# Patient Record
Sex: Male | Born: 1975 | Race: White | Hispanic: No | Marital: Married | Smoking: Never smoker
Health system: Southern US, Community
[De-identification: ages and names within clinical notes are randomized; demographics above are authoritative.]

## PROBLEM LIST (undated history)

## (undated) DIAGNOSIS — M1990 Unspecified osteoarthritis, unspecified site: Secondary | ICD-10-CM

## (undated) DIAGNOSIS — I10 Essential (primary) hypertension: Secondary | ICD-10-CM

## (undated) HISTORY — DX: Essential (primary) hypertension: I10

## (undated) HISTORY — PX: SHOULDER SURGERY: SHX246

---

## 2010-07-09 ENCOUNTER — Ambulatory Visit: Payer: Self-pay | Admitting: Sports Medicine

## 2010-10-23 ENCOUNTER — Ambulatory Visit: Payer: Self-pay | Admitting: Family Medicine

## 2012-05-19 ENCOUNTER — Ambulatory Visit: Payer: Self-pay | Admitting: Family Medicine

## 2012-05-19 LAB — DOT URINE DIP: Specific Gravity: 1.005 (ref 1.003–1.030)

## 2014-07-18 ENCOUNTER — Ambulatory Visit: Payer: Self-pay | Admitting: Family Medicine

## 2017-05-08 ENCOUNTER — Telehealth: Payer: Self-pay | Admitting: Family

## 2017-05-08 DIAGNOSIS — B9689 Other specified bacterial agents as the cause of diseases classified elsewhere: Secondary | ICD-10-CM

## 2017-05-08 DIAGNOSIS — J028 Acute pharyngitis due to other specified organisms: Secondary | ICD-10-CM

## 2017-05-08 MED ORDER — AZITHROMYCIN 250 MG PO TABS: ORAL_TABLET | ORAL | 0 refills | Status: DC

## 2017-05-08 MED ORDER — BENZONATATE 100 MG PO CAPS: 100.0000 mg | ORAL_CAPSULE | Freq: Three times a day (TID) | ORAL | 0 refills | Status: DC | PRN

## 2017-05-08 NOTE — Progress Notes (Signed)

## 2018-10-30 ENCOUNTER — Other Ambulatory Visit: Payer: Self-pay

## 2018-10-30 ENCOUNTER — Ambulatory Visit
Admission: EM | Admit: 2018-10-30 | Discharge: 2018-10-30 | Disposition: A | Discharge status: Home / Self Care | Payer: BC Managed Care – PPO | Attending: Family Medicine | Admitting: Family Medicine

## 2018-10-30 ENCOUNTER — Ambulatory Visit (INDEPENDENT_AMBULATORY_CARE_PROVIDER_SITE_OTHER): Payer: BC Managed Care – PPO

## 2018-10-30 ENCOUNTER — Encounter: Payer: Self-pay | Admitting: Emergency Medicine

## 2018-10-30 DIAGNOSIS — W010XXA Fall on same level from slipping, tripping and stumbling without subsequent striking against object, initial encounter: Secondary | ICD-10-CM

## 2018-10-30 DIAGNOSIS — M25511 Pain in right shoulder: Secondary | ICD-10-CM

## 2018-10-30 DIAGNOSIS — S46911A Strain of unspecified muscle, fascia and tendon at shoulder and upper arm level, right arm, initial encounter: Secondary | ICD-10-CM | POA: Diagnosis not present

## 2018-10-30 MED ORDER — HYDROCODONE-ACETAMINOPHEN 5-325 MG PO TABS: ORAL_TABLET | ORAL | 0 refills | Status: DC

## 2018-10-30 NOTE — Discharge Instructions (Signed)
Rest, ice, sling for rest Follow up with orthopedist if no improvement

## 2018-10-30 NOTE — ED Provider Notes (Signed)
[facility]    CSN: [id number] Arrival date & time: 10/30/18  1405     History   Chief Complaint Chief Complaint  Patient presents with  . Shoulder Pain    HPI Bob Ryan is a 43 y.o. male.   43 yo male with a c/o right shoulder pain after falling today and landing on outstretched arm. States pain is worse with movement. States he's had problems with his shoulders in the past.    Shoulder Pain   History reviewed. No pertinent past medical history.  There are no active problems to display for this patient.   History reviewed. No pertinent surgical history.     Home Medications    Prior to Admission medications   Medication Sig Start Date End Date Taking? Authorizing Provider  azithromycin (ZITHROMAX) 250 MG tablet Take 2 tabs now then 1 daily times 4 days 05/08/17   Withrow, Elyse Jarvis, FNP  benzonatate (TESSALON PERLES) 100 MG capsule Take 1-2 capsules (100-200 mg total) by mouth every 8 (eight) hours as needed. 05/08/17   Withrow, Elyse Jarvis, FNP  HYDROcodone-acetaminophen (NORCO/VICODIN) 5-325 MG tablet 1-2 tabs po bid prn 10/30/18   Norval Gable, MD    Family History Family History  Problem Relation Age of Onset  . Healthy Mother   . Healthy Father     Social History Social History   Tobacco Use  . Smoking status: Never Smoker  . Smokeless tobacco: Current User    Types: Snuff  Substance Use Topics  . Alcohol use: Yes  . Drug use: Not on file     Allergies   Patient has no known allergies.   Review of Systems Review of Systems   Physical Exam Triage Vital Signs ED Triage Vitals  Enc Vitals Group     BP 10/30/18 1424 (!) 148/88     Pulse Rate 10/30/18 1424 81     Resp 10/30/18 1424 18     Temp 10/30/18 1424 98.4 F (36.9 C)     Temp src --      SpO2 10/30/18 1424 100 %     Weight 10/30/18 1424 200 lb (90.7 kg)     Height 10/30/18 1424 5\' 8"  (1.727 m)     Head Circumference --      Peak Flow --      Pain Score 10/30/18 1423  6     Pain Loc --      Pain Edu? --      Excl. in [place]? --    No data found.  Updated Vital Signs BP (!) 148/88 (BP Location: Left Arm)   Pulse 81   Temp 98.4 F (36.9 C)   Resp 18   Ht 5\' 8"  (1.727 m)   Wt 90.7 kg   SpO2 100%   BMI 30.41 kg/m   Visual Acuity Right Eye Distance:   Left Eye Distance:   Bilateral Distance:    Right Eye Near:   Left Eye Near:    Bilateral Near:     Physical Exam Vitals signs and nursing note reviewed.  Constitutional:      General: He is not in acute distress.    Appearance: He is not toxic-appearing or diaphoretic.  Musculoskeletal:     Right shoulder: He exhibits decreased range of motion, tenderness and swelling. He exhibits no effusion, no crepitus, no deformity, no laceration and normal pulse.  Neurological:     Mental Status: He is alert.  UC Treatments / Results  Labs (all labs ordered are listed, but only abnormal results are displayed) Labs Reviewed - No data to display  EKG   Radiology Dg Shoulder Right  Result Date: 10/30/2018 CLINICAL DATA:  Right shoulder pain after fall. EXAM: RIGHT SHOULDER - 2+ VIEW COMPARISON:  None. FINDINGS: There is no evidence of fracture or dislocation. There is no evidence of arthropathy or other focal bone abnormality. Soft tissues are unremarkable. IMPRESSION: Negative. Electronically Signed   By: Gerome Samavid  Williams III M.D   On: 10/30/2018 15:58    Procedures Procedures (including critical care time)  Medications Ordered in UC Medications - No data to display  Initial Impression / Assessment and Plan / UC Course  I have reviewed the triage vital signs and the nursing notes.  Pertinent labs & imaging results that were available during my care of the patient were reviewed by me and considered in my medical decision making (see chart for details).      Final Clinical Impressions(s) / UC Diagnoses   Final diagnoses:  Strain of right shoulder, initial encounter    ED  Prescriptions    Medication Sig Dispense Auth. Provider   HYDROcodone-acetaminophen (NORCO/VICODIN) 5-325 MG tablet 1-2 tabs po bid prn 10 tablet Payton Mccallumonty, Alexcis Bicking, MD      1. x-ray results (negative) and diagnosis reviewed with patient 2. rx as per orders above; reviewed possible side effects, interactions, risks and benefits  3. Recommend supportive treatment with rest, ice/heat 4. Follow-up prn if symptoms worsen or don't improve   Controlled Substance Prescriptions [place] Controlled Substance Registry consulted? Not Applicable   Payton Mccallumonty, Kolt Mcwhirter, MD 10/30/18 1630

## 2018-10-30 NOTE — ED Triage Notes (Signed)
Patient states he has had shoulder trouble for years but today fell off a piece of equipment and injured it again

## 2021-03-08 ENCOUNTER — Encounter: Payer: Self-pay | Admitting: Surgery

## 2021-03-08 ENCOUNTER — Encounter: Payer: Self-pay | Admitting: Emergency Medicine

## 2021-03-08 ENCOUNTER — Other Ambulatory Visit: Payer: Self-pay

## 2021-03-08 ENCOUNTER — Ambulatory Visit
Admission: EM | Admit: 2021-03-08 | Discharge: 2021-03-08 | Disposition: A | Discharge status: Home / Self Care | Payer: BC Managed Care – PPO | Attending: Emergency Medicine | Admitting: Emergency Medicine

## 2021-03-08 ENCOUNTER — Ambulatory Visit (INDEPENDENT_AMBULATORY_CARE_PROVIDER_SITE_OTHER): Payer: BC Managed Care – PPO | Admitting: Surgery

## 2021-03-08 ENCOUNTER — Other Ambulatory Visit: Payer: Self-pay | Admitting: Surgery

## 2021-03-08 VITALS — BP 154/103 | HR 81 | Temp 98.5°F | Ht 68.0 in | Wt 203.4 lb

## 2021-03-08 DIAGNOSIS — K644 Residual hemorrhoidal skin tags: Secondary | ICD-10-CM

## 2021-03-08 DIAGNOSIS — K649 Unspecified hemorrhoids: Secondary | ICD-10-CM

## 2021-03-08 DIAGNOSIS — K645 Perianal venous thrombosis: Secondary | ICD-10-CM

## 2021-03-08 MED ORDER — LIDOCAINE-PRILOCAINE 2.5-2.5 % EX CREA: 1.0000 "application " | TOPICAL_CREAM | CUTANEOUS | 0 refills | Status: DC | PRN

## 2021-03-08 MED ORDER — HYDROCORTISONE (PERIANAL) 2.5 % EX CREA: 1.0000 "application " | TOPICAL_CREAM | Freq: Two times a day (BID) | CUTANEOUS | 0 refills | Status: DC

## 2021-03-08 NOTE — Patient Instructions (Signed)
If you have any concerns or questions, please feel free to call our office.   Hemorrhoids Hemorrhoids are swollen veins that may develop: In the butt (rectum). These are called internal hemorrhoids. Around the opening of the butt (anus). These are called external hemorrhoids. Hemorrhoids can cause pain, itching, or bleeding. Most of the time, they do not cause serious problems. They usually get better with diet changes, lifestyle changes, and other home treatments. What are the causes? This condition may be caused by: Having trouble pooping (constipation). Pushing hard (straining) to poop. Watery poop (diarrhea). Pregnancy. Being very overweight (obese). Sitting for long periods of time. Heavy lifting or other activity that causes you to strain. Anal sex. Riding a bike for a long period of time. What are the signs or symptoms? Symptoms of this condition include: Pain. Itching or soreness in the butt. Bleeding from the butt. Leaking poop. Swelling in the area. One or more lumps around the opening of your butt. How is this diagnosed? A doctor can often diagnose this condition by looking at the affected area. The doctor may also: Do an exam that involves feeling the area with a gloved hand (digital rectal exam). Examine the area inside your butt using a small tube (anoscope). Order blood tests. This may be done if you have lost a lot of blood. Have you get a test that involves looking inside the colon using a flexible tube with a camera on the end (sigmoidoscopy or colonoscopy). How is this treated? This condition can usually be treated at home. Your doctor may tell you to change what you eat, make lifestyle changes, or try home treatments. If these do not help, procedures can be done to remove the hemorrhoids or make them smaller. These may involve: Placing rubber bands at the base of the hemorrhoids to cut off their blood supply. Injecting medicine into the hemorrhoids to shrink  them. Shining a type of light energy onto the hemorrhoids to cause them to fall off. Doing surgery to remove the hemorrhoids or cut off their blood supply. Follow these instructions at home: Eating and drinking  Eat foods that have a lot of fiber in them. These include whole grains, beans, nuts, fruits, and vegetables. Ask your doctor about taking products that have added fiber (fibersupplements). Reduce the amount of fat in your diet. You can do this by: Eating low-fat dairy products. Eating less red meat. Avoiding processed foods. Drink enough fluid to keep your pee (urine) pale yellow. Managing pain and swelling  Take a warm-water bath (sitz bath) for 20 minutes to ease pain. Do this 3-4 times a day. You may do this in a bathtub or using a portable sitz bath that fits over the toilet. If told, put ice on the painful area. It may be helpful to use ice between your warm baths. Put ice in a plastic bag. Place a towel between your skin and the bag. Leave the ice on for 20 minutes, 2-3 times a day. General instructions Take over-the-counter and prescription medicines only as told by your doctor. Medicated creams and medicines may be used as told. Exercise often. Ask your doctor how much and what kind of exercise is best for you. Go to the bathroom when you have the urge to poop. Do not wait. Avoid pushing too hard when you poop. Keep your butt dry and clean. Use wet toilet paper or moist towelettes after pooping. Do not sit on the toilet for a long time. Keep all follow-up visits   as told by your doctor. This is important. Contact a doctor if you: Have pain and swelling that do not get better with treatment or medicine. Have trouble pooping. Cannot poop. Have pain or swelling outside the area of the hemorrhoids. Get help right away if you have: Bleeding that will not stop. Summary Hemorrhoids are swollen veins in the butt or around the opening of the butt. They can cause pain,  itching, or bleeding. Eat foods that have a lot of fiber in them. These include whole grains, beans, nuts, fruits, and vegetables. Take a warm-water bath (sitz bath) for 20 minutes to ease pain. Do this 3-4 times a day. This information is not intended to replace advice given to you by your health care provider. Make sure you discuss any questions you have with your health care provider. Document Revised: 10/25/2020 Document Reviewed: 10/25/2020 Elsevier Patient Education  2022 [employer] Inc[employer]  

## 2021-03-08 NOTE — Progress Notes (Signed)
Patient ID: Bob Ryan, male   DOB: 06-30-1975, 45 y.o.   MRN: [id number]  Chief Complaint: Painful external hemorrhoid  History of Present Illness Bob Ryan is a 45 y.o. male with a 5-year history of hemorrhoids known to him.  It became its severest about 2 days ago.  He denies any prior history of thrombosis.  He denies any history of constipation reporting that he moves every day.  He admits there are times when he does strain, but most of this has to do with being in a hurry.  He denies any blood in his stools.  He reports pain and itching.  He has utilized sitz bath since the onset.  He is also utilize Preparation H topically without significant relief and Tucks pads.  He denies any family history of colon cancer and has never had a colonoscopy.  Past Medical History History reviewed. No pertinent past medical history.    History reviewed. No pertinent surgical history.  No Known Allergies  Current Outpatient Medications  Medication Sig Dispense Refill   hydrocortisone (ANUSOL-HC) 2.5 % rectal cream Place 1 application rectally 2 (two) times daily. (Patient not taking: Reported on 03/08/2021) 30 g 0   lidocaine-prilocaine (EMLA) cream Apply 1 application topically as needed. (Patient not taking: Reported on 03/08/2021) 30 g 0   No current facility-administered medications for this visit.    Family History Family History  Problem Relation Age of Onset   Healthy Mother    Healthy Father       Social History Social History   Tobacco Use   Smoking status: Never   Smokeless tobacco: Current    Types: Snuff  Substance Use Topics   Alcohol use: Yes        Review of Systems  All other systems reviewed and are negative.    Physical Exam Blood pressure (!) 154/103, pulse 81, temperature 98.5 F (36.9 C), temperature source Oral, height 5\' 8"  (1.727 m), weight 203 lb 6.4 oz (92.3 kg), SpO2 99 %. Last Weight  Most recent update: 03/08/2021  3:28 PM    Weight   92.3 kg (203 lb 6.4 oz)             CONSTITUTIONAL: Well developed, and nourished, appropriately responsive and aware without distress.   EYES: Sclera non-icteric.   EARS, NOSE, MOUTH AND THROAT: Mask worn.   The oropharynx is clear. Oral mucosa is pink and moist.    Hearing is intact to voice.  NECK: Trachea is midline, and there is no jugular venous distension.  LYMPH NODES:  Lymph nodes in the neck are not enlarged. RESPIRATORY:  Lungs are clear, and breath sounds are equal bilaterally. Normal respiratory effort without pathologic use of accessory muscles. CARDIOVASCULAR: Heart is regular in rate and rhythm. GI: The abdomen is soft, nontender, and nondistended. There were no palpable masses. I did not appreciate hepatosplenomegaly. There were normal bowel sounds. GU: External to the anus on the patient's left side there is a large 2 to 2-1/2 cm thrombosed mass consistent with a large external hemorrhoidal thrombosis.  Fortunately there appears to be a small pedicle upon which this lies.  It is difficult to assess the degree of internal hemorrhoids present but this external hemorrhoid seems relatively mobile and I believe amenable to excision under local anesthesia.  The patient is highly motivated to proceed with this approach rather than deferring scheduling surgery. MUSCULOSKELETAL:  Symmetrical muscle tone appreciated in all four extremities.    SKIN: Skin  turgor is normal. No pathologic skin lesions appreciated.  NEUROLOGIC:  Motor and sensation appear grossly normal.  Cranial nerves are grossly without defect. PSYCH:  Alert and oriented to person, place and time. Affect is appropriate for situation.  Data Reviewed I have personally reviewed what is currently available of the patient's imaging, recent labs and medical records.   Labs:  No flowsheet data found. No flowsheet data found.    Imaging:  Within last 24 hrs: No results found.  Assessment    External thrombosed  hemorrhoid. There are no problems to display for this patient.   Plan    External hemorrhoidectomy.  Procedure detail: After informed consent was obtained the patient was placed in a kneeling position, the region was prepped with Betadine, and local anesthesia of 1% lidocaine with epinephrine is performed to adequate anesthetic effect.  This hemostat was placed across the base of the external hemorrhoid, and left intact for 3 minutes.  With the clamp in place the external hemorrhoid was sharply excised with a 15 blade.  The clamp was removed with excellent hemostasis.  An ABD pad was given to protect this area and secured with the patient's underwear. Patient was advised to use the topical anesthetic agents, complete sits baths on a 3 times daily and as needed basis.  He was advised to expect some degree of bleeding with future bowel movements. We will have him follow-up in 2 to 3 weeks or as needed. Face-to-face time spent with the patient and accompanying care providers(if present) was 50 minutes, with more than 50% of the time spent counseling, educating, and coordinating care of the patient.    These notes generated with voice recognition software. I apologize for typographical errors.  Campbell Lerner M.D., FACS 03/08/2021, 8:20 PM

## 2021-03-08 NOTE — ED Triage Notes (Signed)
Pt presents today with rectal pain that he describes as "a Hemorroid". He reports having a hx x 5 years but has gotten worse since Tuesday morning. He has used OTC Prep H, Tucks cream/wipes with no relief. Denies bleeding.

## 2021-03-08 NOTE — ED Provider Notes (Signed)
[facility]    CSN: [id number] Arrival date & time: 03/08/21  7616      History   Chief Complaint Chief Complaint  Patient presents with   Rectal Pain    HPI Bob Ryan is a 45 y.o. male.   Patient presents with external hemorrhoid causing rectal pain for 3 days.  Endorses hemorrhoid has increased in size and become more tender.  Hemorrhoid has been present for 5 years. denies rectal bleeding.  Endorses that he has regular bowel movements with no issue of constipation, endorses that he takes MiraLAX as needed to prevent recurrence.  Last bowel movement this morning.  Has attempted use of Preparation H and Tucks with no relief.  Does not have primary care doctor.  History reviewed. No pertinent past medical history.  There are no problems to display for this patient.   History reviewed. No pertinent surgical history.     Home Medications    Prior to Admission medications   Medication Sig Start Date End Date Taking? Authorizing Provider  hydrocortisone (ANUSOL-HC) 2.5 % rectal cream Place 1 application rectally 2 (two) times daily. 03/08/21  Yes Mattis Featherly R, NP  lidocaine-prilocaine (EMLA) cream Apply 1 application topically as needed. 03/08/21  Yes Uziel Covault, Elita Boone, NP    Family History Family History  Problem Relation Age of Onset   Healthy Mother    Healthy Father     Social History Social History   Tobacco Use   Smoking status: Never   Smokeless tobacco: Current    Types: Snuff  Substance Use Topics   Alcohol use: Yes     Allergies   Patient has no known allergies.   Review of Systems Review of Systems  Constitutional: Negative.   Respiratory: Negative.    Cardiovascular: Negative.   Gastrointestinal: Negative.   Genitourinary: Negative.   Skin: Negative.     Physical Exam Triage Vital Signs ED Triage Vitals  Enc Vitals Group     BP 03/08/21 1016 (!) 161/97     Pulse Rate 03/08/21 1016 88     Resp 03/08/21 1016  18     Temp 03/08/21 1016 98.7 F (37.1 C)     Temp Source 03/08/21 1016 Oral     SpO2 03/08/21 1016 95 %     Weight --      Height --      Head Circumference --      Peak Flow --      Pain Score 03/08/21 1014 5     Pain Loc --      Pain Edu? --      Excl. in [place]? --    No data found.  Updated Vital Signs BP (!) 161/97 (BP Location: Right Arm)   Pulse 88   Temp 98.7 F (37.1 C) (Oral)   Resp 18   SpO2 95%   Visual Acuity Right Eye Distance:   Left Eye Distance:   Bilateral Distance:    Right Eye Near:   Left Eye Near:    Bilateral Near:     Physical Exam Genitourinary:    Rectum: Tenderness and external hemorrhoid present. No anal fissure or internal hemorrhoid. Normal anal tone.       Comments: No rectal bleeding, strangulated noted, hemorrhoid is firm but not hardened      UC Treatments / Results  Labs (all labs ordered are listed, but only abnormal results are displayed) Labs Reviewed - No data to display  EKG  Radiology No results found.  Procedures Procedures (including critical care time)  Medications Ordered in UC Medications - No data to display  Initial Impression / Assessment and Plan / UC Course  I have reviewed the triage vital signs and the nursing notes.  Pertinent labs & imaging results that were available during my care of the patient were reviewed by me and considered in my medical decision making (see chart for details).  External hemorrhoid  Hydrocortisone 2.5% rectal cream twice daily Lidocaine-prilocaine cream as needed Given walking referral for surgical evaluation for removal Strict return precautions and warning signs discussed about strangulation and when to return to emergency department for evaluation Final Clinical Impressions(s) / UC Diagnoses   Final diagnoses:  External hemorrhoid     Discharge Instructions      You can apply hydrocortisone cream to hemorrhoid twice a day, this medication helps with  inflammation and ideally will help to reduce the size   You can apply lidocaine cream to area as needed, this medication is for comfort as lidocaine has a very temporary numbing effect  You may follow-up with [facility], information listed below for evaluation for removal of your hemorrhoid if symptoms do not improve or you just want it to be gone   ED Prescriptions     Medication Sig Dispense Auth. Provider   hydrocortisone (ANUSOL-HC) 2.5 % rectal cream Place 1 application rectally 2 (two) times daily. 30 g Ethelene Closser R, NP   lidocaine-prilocaine (EMLA) cream Apply 1 application topically as needed. 30 g Valinda Hoar, NP      PDMP not reviewed this encounter.   Valinda Hoar, NP 03/08/21 1043

## 2021-03-08 NOTE — Discharge Instructions (Signed)
You can apply hydrocortisone cream to hemorrhoid twice a day, this medication helps with inflammation and ideally will help to reduce the size   You can apply lidocaine cream to area as needed, this medication is for comfort as lidocaine has a very temporary numbing effect  You may follow-up with [facility], information listed below for evaluation for removal of your hemorrhoid if symptoms do not improve or you just want it to be gone

## 2021-04-24 ENCOUNTER — Ambulatory Visit
Admission: EM | Admit: 2021-04-24 | Discharge: 2021-04-24 | Disposition: A | Discharge status: Home / Self Care | Payer: BC Managed Care – PPO | Attending: Internal Medicine | Admitting: Internal Medicine

## 2021-04-24 ENCOUNTER — Ambulatory Visit (INDEPENDENT_AMBULATORY_CARE_PROVIDER_SITE_OTHER): Payer: BC Managed Care – PPO

## 2021-04-24 ENCOUNTER — Other Ambulatory Visit: Payer: Self-pay

## 2021-04-24 DIAGNOSIS — R509 Fever, unspecified: Secondary | ICD-10-CM

## 2021-04-24 DIAGNOSIS — J40 Bronchitis, not specified as acute or chronic: Secondary | ICD-10-CM

## 2021-04-24 DIAGNOSIS — R059 Cough, unspecified: Secondary | ICD-10-CM

## 2021-04-24 MED ORDER — AZITHROMYCIN 250 MG PO TABS: ORAL_TABLET | ORAL | 0 refills | Status: DC

## 2021-04-24 MED ORDER — BENZONATATE 200 MG PO CAPS: 200.0000 mg | ORAL_CAPSULE | Freq: Two times a day (BID) | ORAL | 0 refills | Status: DC | PRN

## 2021-04-24 MED ORDER — ALBUTEROL SULFATE HFA 108 (90 BASE) MCG/ACT IN AERS: 2.0000 | INHALATION_SPRAY | RESPIRATORY_TRACT | 0 refills | Status: DC | PRN

## 2021-04-24 MED ORDER — HYDROCODONE BIT-HOMATROP MBR 5-1.5 MG/5ML PO SOLN: 5.0000 mL | Freq: Every evening | ORAL | 0 refills | Status: DC

## 2021-04-24 NOTE — ED Triage Notes (Signed)
Pt presents with 10 days of illness.  Vomiting/fever started 12/17 lasting a few days.  Symptoms mostly resolved after a few days then returned.  Fever gone since Sunday night.  Continues cough productive for brown mucous, sinus drainage has reddish tinge.  Feels congested.  Still has chills.  General feeling of unwell.    Has tried OTC cold/flu meds with no relief.

## 2021-04-24 NOTE — ED Provider Notes (Signed)
[facility]    CSN: [id number] Arrival date & time: 04/24/21  3474      History   Chief Complaint Chief Complaint  Patient presents with   Cough    HPI Bob Ryan is a 45 y.o. male who presents with URI x 10 days. Had a fever last week but resolved  2 days ago. Vomited for a few days starting 12/17. He continues to have a productive cough with brown sputum and nose drainage has a reddish tinge. Continues to have chills. He did not do a covid test during his sickness. Has been fatigued, with decreased appetite and has continued sweating.    History reviewed. No pertinent past medical history.  There are no problems to display for this patient.   History reviewed. No pertinent surgical history.     Home Medications    Prior to Admission medications   Medication Sig Start Date End Date Taking? Authorizing Provider  albuterol (VENTOLIN HFA) 108 (90 Base) MCG/ACT inhaler Inhale 2 puffs into the lungs every 4 (four) hours as needed for wheezing or shortness of breath. For 7 days 04/24/21  Yes Rodriguez-Southworth, Nettie Elm, PA-C  azithromycin (ZITHROMAX Z-PAK) 250 MG tablet Take 2 today then one qd x 4 days 04/24/21  Yes Rodriguez-Southworth, Nettie Elm, PA-C  benzonatate (TESSALON) 200 MG capsule Take 1 capsule (200 mg total) by mouth 2 (two) times daily as needed. For day time cough 04/24/21  Yes Rodriguez-Southworth, Nettie Elm, PA-C  HYDROcodone bit-homatropine (HYCODAN) 5-1.5 MG/5ML syrup Take 5 mLs by mouth at bedtime. Prn night time cough 04/24/21  Yes Rodriguez-Southworth, Nettie Elm, PA-C    Family History Family History  Problem Relation Age of Onset   Healthy Mother    Healthy Father     Social History Social History   Tobacco Use   Smoking status: Never   Smokeless tobacco: Current    Types: Snuff  Vaping Use   Vaping Use: Never used  Substance Use Topics   Alcohol use: Yes    Comment: occ     Allergies   Patient has no known  allergies.   Review of Systems Review of Systems  Constitutional:  Positive for activity change, appetite change, chills, diaphoresis and fatigue. Negative for fever.  HENT:  Positive for congestion and postnasal drip. Negative for ear discharge, ear pain, sore throat and trouble swallowing.   Eyes:  Negative for discharge.  Respiratory:  Positive for cough and wheezing. Negative for chest tightness and shortness of breath.   Cardiovascular:  Negative for chest pain.  Gastrointestinal:  Negative for diarrhea, nausea and vomiting.  Musculoskeletal:  Negative for myalgias.  Skin:  Negative for rash.  Neurological:  Negative for headaches.    Physical Exam Triage Vital Signs ED Triage Vitals  Enc Vitals Group     BP 04/24/21 0838 (!) 141/95     Pulse Rate 04/24/21 0838 68     Resp 04/24/21 0838 18     Temp 04/24/21 0838 98.2 F (36.8 C)     Temp Source 04/24/21 0838 Oral     SpO2 04/24/21 0838 98 %     Weight --      Height --      Head Circumference --      Peak Flow --      Pain Score 04/24/21 0835 0     Pain Loc --      Pain Edu? --      Excl. in GC? --    No  data found.  Updated Vital Signs BP (!) 141/95 (BP Location: Left Arm)    Pulse 68    Temp 98.2 F (36.8 C) (Oral)    Resp 18    SpO2 98%   Visual Acuity Right Eye Distance:   Left Eye Distance:   Bilateral Distance:    Right Eye Near:   Left Eye Near:    Bilateral Near:     Physical Exam Physical Exam Constitutional:      General: He is not in acute distress.    Appearance: He is not toxic-appearing.  HENT:     Head: Normocephalic.     Right Ear: Tympanic membrane, ear canal and external ear normal.     Left Ear: Ear canal and external ear normal.     Nose: Nose normal.     Mouth/Throat:     Mouth: Mucous membranes are moist.     Pharynx: Oropharynx is clear.  Eyes:     General: No scleral icterus.    Conjunctiva/sclera: Conjunctivae normal.  Cardiovascular:     Rate and Rhythm: Normal rate  and regular rhythm.     Heart sounds: No murmur heard.   Pulmonary:     Effort: Pulmonary effort is normal. No respiratory distress.     Breath sounds: Wheezing present.     Comments: Has a wheezy cough Musculoskeletal:        General: Normal range of motion.     Cervical back: Neck supple.  Lymphadenopathy:     Cervical: No cervical adenopathy.  Skin:    General: Skin is warm and dry.     Findings: No rash.  Neurological:     Mental Status: He is alert and oriented to person, place, and time.     Gait: Gait normal.  Psychiatric:        Mood and Affect: Mood normal.        Behavior: Behavior normal.        Thought Content: Thought content normal.        Judgment: Judgment normal.    UC Treatments / Results  Labs (all labs ordered are listed, but only abnormal results are displayed) Labs Reviewed - No data to display  EKG   Radiology DG Chest 2 View  Result Date: 04/24/2021 CLINICAL DATA:  Cough and fever EXAM: CHEST - 2 VIEW COMPARISON:  None. FINDINGS: The heart size and mediastinal contours are within normal limits. Both lungs are clear. The visualized skeletal structures are unremarkable. IMPRESSION: No active cardiopulmonary disease. Electronically Signed   By: Allegra Lai M.D.   On: 04/24/2021 09:04    Procedures Procedures (including critical care time)  Medications Ordered in UC Medications - No data to display  Initial Impression / Assessment and Plan / UC Course  I have reviewed the triage vital signs and the nursing notes. Pertinent imaging results that were available during my care of the patient were reviewed by me and considered in my medical decision making (see chart for details). Has bacterial bronchitis. I suspect he probably had covid I placed him on Azithromycin, Tessalon, Albuterol inhaler and Hycodan as noted.     Final Clinical Impressions(s) / UC Diagnoses   Final diagnoses:  Bronchitis   Discharge Instructions   None    ED  Prescriptions     Medication Sig Dispense Auth. Provider   azithromycin (ZITHROMAX Z-PAK) 250 MG tablet Take 2 today then one qd x 4 days 6 tablet Rodriguez-Southworth, Nettie Elm, PA-C   albuterol (  VENTOLIN HFA) 108 (90 Base) MCG/ACT inhaler Inhale 2 puffs into the lungs every 4 (four) hours as needed for wheezing or shortness of breath. For 7 days 1 g Rodriguez-Southworth, Nettie Elm, PA-C   benzonatate (TESSALON) 200 MG capsule Take 1 capsule (200 mg total) by mouth 2 (two) times daily as needed. For day time cough 30 capsule Rodriguez-Southworth, Briggett Tuccillo, PA-C   HYDROcodone bit-homatropine (HYCODAN) 5-1.5 MG/5ML syrup Take 5 mLs by mouth at bedtime. Prn night time cough 120 mL Rodriguez-Southworth, Nettie Elm, PA-C      I have reviewed the PDMP during this encounter.   Garey Ham, [place] 04/24/21 [phone]

## 2021-07-27 ENCOUNTER — Other Ambulatory Visit: Payer: Self-pay

## 2021-07-27 ENCOUNTER — Ambulatory Visit
Admission: RE | Admit: 2021-07-27 | Discharge: 2021-07-27 | Disposition: A | Discharge status: Home / Self Care | Payer: BC Managed Care – PPO | Source: Ambulatory Visit | Attending: Emergency Medicine | Admitting: Emergency Medicine

## 2021-07-27 VITALS — BP 157/108 | HR 87 | Temp 98.4°F | Resp 15 | Ht 68.0 in | Wt 205.0 lb

## 2021-07-27 DIAGNOSIS — R030 Elevated blood-pressure reading, without diagnosis of hypertension: Secondary | ICD-10-CM

## 2021-07-27 MED ORDER — LISINOPRIL 5 MG PO TABS: 5.0000 mg | ORAL_TABLET | Freq: Every day | ORAL | 0 refills | Status: DC

## 2021-07-27 NOTE — ED Triage Notes (Signed)
Patient states that he went yesterday for a dental appointment yesterday and was told his BP was elevated.  Patient states that yesterday he did have pressure in his head but is not having it today.  Patient states that he has not been on any BP medicines in the past. Patient does not have a PCP.  ?

## 2021-07-27 NOTE — Discharge Instructions (Addendum)
Begin use of lisinopril daily taking around the same time ? ?Take blood pressure daily around the same time while calm and relaxed, record and take with you to primary care ? ?Avoid use of increased salt intake  ? ? ?Appointment with Primary care May 5th at 2 pm.  Primary care is information is listed below, please call office if needing to reschedule appointment ? ?At any point if you begin to have a severe headache, shortness of breath, chest pain or tightness, dizziness or lightheadedness and your blood pressure is elevated you will need to go to the nearest emergency department for further evaluation and management ? ? ? ? ? ? ? ?

## 2021-07-27 NOTE — ED Provider Notes (Signed)
?[facility] ? ? ? ?CSN: [id number] ?Arrival date & time: 07/27/21  4098 ? ? ?  ? ?History   ?Chief Complaint ?Chief Complaint  ?Patient presents with  ? Appointment  ? Hypertension  ? ? ?HPI ?Bob Ryan is a 46 y.o. male.  ? ?Patient presents for evaluation of his blood pressure after being notified of elevation at the dentist office 1 day ago.  Blood pressure was 167/102 at dentist.  Cherlynn Perches history of elevated blood pressure in the 20s, started medication briefly but was shortly taken off, denies diagnosis of hypertension.  Endorses that he intermittently has headaches and a sensation of feeling flushed primarily when he is very busy, unsure if this is related.  Endorses that 1 week ago he had a episode of lightheadedness while working out at [employer], resolved after resting eating.  Denies shortness of breath, chest pain or tightness, dizziness, lightheadedness, generalized weakness or visual disturbance. ? ?History reviewed. No pertinent past medical history. ? ?There are no problems to display for this patient. ? ? ?History reviewed. No pertinent surgical history. ? ? ? ? ?Home Medications   ? ?Prior to Admission medications   ?Medication Sig Start Date End Date Taking? Authorizing Provider  ?albuterol (VENTOLIN HFA) 108 (90 Base) MCG/ACT inhaler Inhale 2 puffs into the lungs every 4 (four) hours as needed for wheezing or shortness of breath. For 7 days 04/24/21   Rodriguez-Southworth, Nettie Elm, PA-C  ?azithromycin (ZITHROMAX Z-PAK) 250 MG tablet Take 2 today then one qd x 4 days 04/24/21   Rodriguez-Southworth, Nettie Elm, PA-C  ?benzonatate (TESSALON) 200 MG capsule Take 1 capsule (200 mg total) by mouth 2 (two) times daily as needed. For day time cough 04/24/21   Rodriguez-Southworth, Nettie Elm, PA-C  ?HYDROcodone bit-homatropine (HYCODAN) 5-1.5 MG/5ML syrup Take 5 mLs by mouth at bedtime. Prn night time cough 04/24/21   Rodriguez-Southworth, Nettie Elm, PA-C  ? ? ?Family History ?Family History  ?Problem  Relation Age of Onset  ? Healthy Mother   ? Healthy Father   ? ? ?Social History ?Social History  ? ?Tobacco Use  ? Smoking status: Never  ? Smokeless tobacco: Current  ?  Types: Snuff  ?Vaping Use  ? Vaping Use: Never used  ?Substance Use Topics  ? Alcohol use: Yes  ?  Comment: occ  ? Drug use: Never  ? ? ? ?Allergies   ?Patient has no known allergies. ? ? ?Review of Systems ?Review of Systems  ?Constitutional: Negative.   ?Respiratory: Negative.    ?Cardiovascular: Negative.   ?Skin: Negative.   ?Neurological: Negative.   ? ? ?Physical Exam ?Triage Vital Signs ?ED Triage Vitals  ?Enc Vitals Group  ?   BP 07/27/21 1004 (!) 157/108  ?   Pulse Rate 07/27/21 1004 87  ?   Resp 07/27/21 1004 15  ?   Temp 07/27/21 1004 98.4 ?F (36.9 ?C)  ?   Temp Source 07/27/21 1004 Oral  ?   SpO2 07/27/21 1004 99 %  ?   Weight 07/27/21 1002 205 lb (93 kg)  ?   Height 07/27/21 1002 5\' 8"  (1.727 m)  ?   Head Circumference --   ?   Peak Flow --   ?   Pain Score 07/27/21 1002 0  ?   Pain Loc --   ?   Pain Edu? --   ?   Excl. in GC? --   ? ?No data found. ? ?Updated Vital Signs ?BP (!) 157/108 (BP Location: Left  Arm)   Pulse 87   Temp 98.4 ?F (36.9 ?C) (Oral)   Resp 15   Ht 5\' 8"  (1.727 m)   Wt 205 lb (93 kg)   SpO2 99%   BMI 31.17 kg/m?  ? ?Visual Acuity ?Right Eye Distance:   ?Left Eye Distance:   ?Bilateral Distance:   ? ?Right Eye Near:   ?Left Eye Near:    ?Bilateral Near:    ? ?Physical Exam ?Constitutional:   ?   Appearance: Normal appearance.  ?HENT:  ?   Head: Normocephalic.  ?Eyes:  ?   Extraocular Movements: Extraocular movements intact.  ?Cardiovascular:  ?   Rate and Rhythm: Normal rate and regular rhythm.  ?   Pulses: Normal pulses.  ?   Heart sounds: Normal heart sounds.  ?Pulmonary:  ?   Effort: Pulmonary effort is normal.  ?   Breath sounds: Normal breath sounds.  ?Skin: ?   General: Skin is warm.  ?Neurological:  ?   Mental Status: He is alert and oriented to person, place, and time. Mental status is at baseline.   ?Psychiatric:     ?   Mood and Affect: Mood normal.     ?   Behavior: Behavior normal.  ? ? ? ?UC Treatments / Results  ?Labs ?(all labs ordered are listed, but only abnormal results are displayed) ?Labs Reviewed - No data to display ? ?EKG ? ? ?Radiology ?No results found. ? ?Procedures ?Procedures (including critical care time) ? ?Medications Ordered in UC ?Medications - No data to display ? ?Initial Impression / Assessment and Plan / UC Course  ?I have reviewed the triage vital signs and the nursing notes. ? ?Pertinent labs & imaging results that were available during my care of the patient were reviewed by me and considered in my medical decision making (see chart for details). ? ?Elevated blood pressure reading in office without diagnosis of hypertension ? ?Blood pressure in triage 157/108, unsure if symptoms the patient has been experiencing over the last few months is related to blood pressure however due to recent elevations in history of needing medication in the past will start patient on lisinopril 5 mg daily with follow-up with PCP in 1 month, appointment scheduled with [facility], advised patient to decrease salt intake, advised patient to check blood pressure daily and record until seen by PCP, given signs of hypertensive urgency and when to follow-up for evaluation ? ? ?Final Clinical Impressions(s) / UC Diagnoses  ? ?Final diagnoses:  ?None  ? ?Discharge Instructions   ?None ?  ? ?ED Prescriptions   ?None ?  ? ?PDMP not reviewed this encounter. ?  ? , NP ?07/27/21 1054 ? ?

## 2021-08-31 ENCOUNTER — Ambulatory Visit (INDEPENDENT_AMBULATORY_CARE_PROVIDER_SITE_OTHER): Payer: BC Managed Care – PPO | Admitting: Family Medicine

## 2021-08-31 ENCOUNTER — Encounter: Payer: Self-pay | Admitting: Family Medicine

## 2021-08-31 VITALS — BP 150/100 | HR 92 | Ht 68.0 in | Wt 208.6 lb

## 2021-08-31 DIAGNOSIS — F5101 Primary insomnia: Secondary | ICD-10-CM | POA: Diagnosis not present

## 2021-08-31 DIAGNOSIS — Z13220 Encounter for screening for lipoid disorders: Secondary | ICD-10-CM | POA: Diagnosis not present

## 2021-08-31 DIAGNOSIS — R0683 Snoring: Secondary | ICD-10-CM | POA: Diagnosis not present

## 2021-08-31 DIAGNOSIS — I10 Essential (primary) hypertension: Secondary | ICD-10-CM

## 2021-08-31 MED ORDER — LISINOPRIL-HYDROCHLOROTHIAZIDE 10-12.5 MG PO TABS: 1.0000 | ORAL_TABLET | Freq: Every day | ORAL | 0 refills | Status: DC

## 2021-08-31 NOTE — Progress Notes (Signed)
?  ? ?Primary Care / Sports Medicine Office Visit ? ?Patient Information:  ?Patient ID: Bob Ryan, male DOB: [birth year] Age: 46 y.o. MRN: [id number]  ? ?Bob Ryan is a pleasant 46 y.o. male presenting with the following: ? ?Chief Complaint  ?Patient presents with  ? Establish Care  ? Hypertension  ? ? ?Vitals:  ? 08/31/21 1341  ?BP: (!) 150/100  ?Pulse: 92  ?SpO2: 96%  ? ?Vitals:  ? 08/31/21 1341  ?Weight: 208 lb 9.6 oz (94.6 kg)  ?Height: 5\' 8"  (1.727 m)  ? ?Body mass index is 31.72 kg/m?. ? ?No results found.  ? ?Independent interpretation of notes and tests performed by another provider:  ? ?None ? ?Procedures performed:  ? ?None ? ?Pertinent History, Exam, Impression, and Recommendations:  ? ?Problem List Items Addressed This Visit   ? ?  ? Cardiovascular and Mediastinum  ? Hypertension - Primary  ?  Chronic issue reported from patient's 20s, no consistent treatment, has been asymptomatic from a cardiopulmonary standpoint and elevated BP readings were incidentally noted at a recent dentist visit.  They have been recalcitrant despite initiation of lisinopril 5 mg through urgent care.  He denies any headache, vision changes, shortness of air, chest pain, has noted vascular prominence and some muscular aches with activity primarily affecting his upper extremities. ? ?Examination reveals no JVD, he has positive S1 and S2, regular rate and rhythm, no additional heart sounds noted, lung fields are clear to auscultation without bibasilar Rales, wheezes, rhonchi, he has symmetric pulses in the extremities, no carotid bruits, no edema at the ankles. ? ?At this stage plan for risk stratification labs, further titration to lisinopril-hydrochlorothiazide 10-12.5, referral to ENT/sleep medicine, lifestyle modification information, and close follow-up in 1 month. ? ?  ?  ? Relevant Medications  ? lisinopril-hydrochlorothiazide (ZESTORETIC) 10-12.5 MG tablet  ? Other Relevant Orders  ? Apo A1 + B + Ratio  ?  Comprehensive metabolic panel  ? Lipid panel  ? TSH  ? CBC  ? Ambulatory referral to Sleep Studies  ?  ? Other  ? Snoring  ?  Chronic condition with progressive worsening over the past several years, specifically sonorous sleeping, daytime somnolence, lack of restful sleep.  He has markedly elevated blood pressure readings and we will pursue ENT/sleep medicine referral for further evaluation of OSA as a possible contributory risk.  Referral placed today in that regard. ? ?  ?  ? Relevant Orders  ? Ambulatory referral to Sleep Studies  ? ?Other Visit Diagnoses   ? ? Screening for lipoid disorders      ? Relevant Orders  ? Apo A1 + B + Ratio  ? Lipid panel  ? Primary insomnia      ? Relevant Orders  ? Ambulatory referral to Sleep Studies  ? ?  ?  ? ?Orders & Medications ?Meds ordered this encounter  ?Medications  ? lisinopril-hydrochlorothiazide (ZESTORETIC) 10-12.5 MG tablet  ?  Sig: Take 1 tablet by mouth daily.  ?  Dispense:  30 tablet  ?  Refill:  0  ? ?Orders Placed This Encounter  ?Procedures  ? Apo A1 + B + Ratio  ? Comprehensive metabolic panel  ? Lipid panel  ? TSH  ? CBC  ? Ambulatory referral to Sleep Studies  ?  ? ?Return in about 4 weeks (around 09/28/2021).  ?  ? ?Montel Culver, MD ? ? Primary Care Sports Medicine ?[facility] ?[place]  ? ?

## 2021-08-31 NOTE — Assessment & Plan Note (Signed)
Chronic issue reported from patient's 20s, no consistent treatment, has been asymptomatic from a cardiopulmonary standpoint and elevated BP readings were incidentally noted at a recent dentist visit.  They have been recalcitrant despite initiation of lisinopril 5 mg through urgent care.  He denies any headache, vision changes, shortness of air, chest pain, has noted vascular prominence and some muscular aches with activity primarily affecting his upper extremities. ? ?Examination reveals no JVD, he has positive S1 and S2, regular rate and rhythm, no additional heart sounds noted, lung fields are clear to auscultation without bibasilar Rales, wheezes, rhonchi, he has symmetric pulses in the extremities, no carotid bruits, no edema at the ankles. ? ?At this stage plan for risk stratification labs, further titration to lisinopril-hydrochlorothiazide 10-12.5, referral to ENT/sleep medicine, lifestyle modification information, and close follow-up in 1 month. ?

## 2021-08-31 NOTE — Patient Instructions (Signed)
-   Take new blood pressure medication daily ?- Reviewed information on healthy lifestyle changes ?- Obtain fasting blood work with orders provided ?- Referral coordinator will contact regards to scheduling sleep medicine visit ?- Return for follow-up in 1 month, contact for any questions/concerns between now and then ?

## 2021-08-31 NOTE — Assessment & Plan Note (Signed)
Chronic condition with progressive worsening over the past several years, specifically sonorous sleeping, daytime somnolence, lack of restful sleep.  He has markedly elevated blood pressure readings and we will pursue ENT/sleep medicine referral for further evaluation of OSA as a possible contributory risk.  Referral placed today in that regard. ?

## 2021-09-28 ENCOUNTER — Ambulatory Visit (INDEPENDENT_AMBULATORY_CARE_PROVIDER_SITE_OTHER): Payer: BC Managed Care – PPO | Admitting: Family Medicine

## 2021-09-28 ENCOUNTER — Encounter: Payer: Self-pay | Admitting: Family Medicine

## 2021-09-28 VITALS — BP 138/92 | HR 84 | Ht 68.0 in | Wt 206.8 lb

## 2021-09-28 DIAGNOSIS — I10 Essential (primary) hypertension: Secondary | ICD-10-CM | POA: Diagnosis not present

## 2021-09-28 DIAGNOSIS — M67911 Unspecified disorder of synovium and tendon, right shoulder: Secondary | ICD-10-CM | POA: Diagnosis not present

## 2021-09-28 MED ORDER — METHOCARBAMOL 500 MG PO TABS: 500.0000 mg | ORAL_TABLET | Freq: Every evening | ORAL | 0 refills | Status: DC | PRN

## 2021-09-28 MED ORDER — VALSARTAN-HYDROCHLOROTHIAZIDE 80-12.5 MG PO TABS: 1.0000 | ORAL_TABLET | Freq: Every day | ORAL | 0 refills | Status: DC

## 2021-09-28 MED ORDER — DICLOFENAC SODIUM 75 MG PO TBEC: 75.0000 mg | DELAYED_RELEASE_TABLET | Freq: Two times a day (BID) | ORAL | 0 refills | Status: DC

## 2021-09-28 NOTE — Assessment & Plan Note (Signed)
Right-hand-dominant patient presenting with acute on chronic right shoulder symptoms.  He states a few years back after a fall he had significant pain and dysfunction, did have x-ray which was negative, this issue resolved following NSAIDs, pain medication, and ramp up and exercise.  Symptoms have been relatively well controlled since that time but he does state over the past week he noticed significant progressive pain after extensive session of overhead tree trimming followed by athletics.  He awoke with an inability to raise his arm, finding positions of comfort, and pain.  Examination shows inability to raise arm actively above the level of the shoulder, rotator cuff testing reveals 4 -/5 strength with external rotation associated pain, 3/5 strength with isolated supraspinatus testing, 5/5 strength with internal rotation.  Contralateral side is benign.  Equivocal Neer's, positive Hawkins, remainder of examination benign.  Concern for acute on chronic rotator cuff injury with noted dysfunction throughout the supraspinatus and external rotators, did discuss surgical and nonsurgical treatments and he is wishing to proceed in a nonsurgical manner.  Plan for scheduled diclofenac, as needed methocarbamol, activity restrictions, and follow-up in 2 weeks.  If suboptimal progress noted, can consider advanced imaging versus ultrasound-guided injection.

## 2021-09-28 NOTE — Progress Notes (Signed)
Primary Care / Sports Medicine Office Visit  Patient Information:  Patient ID: Bob Ryan, male DOB: December 05, 1975 Age: 46 y.o. MRN: [id number]   Bob Ryan is a pleasant 46 y.o. male presenting with the following:  Chief Complaint  Patient presents with   Hypertension    Pt didn't get blood drawn yet, will after appointment. Medication has changed his mood.    Arm Pain    Right arm unable to lift arm, no known injury. 2 years ago did have a fall on shoulder. Doesn't hurt    Vitals:   09/28/21 0804  BP: (!) 138/92  Pulse: 84  SpO2: 98%   Vitals:   09/28/21 0804  Weight: 206 lb 12.8 oz (93.8 kg)  Height: 5\' 8"  (1.727 m)   Body mass index is 31.44 kg/m.  No results found.   Independent interpretation of notes and tests performed by another provider:   None  Procedures performed:   None  Pertinent History, Exam, Impression, and Recommendations:   Problem List Items Addressed This Visit       Cardiovascular and Mediastinum   Hypertension - Primary    Patient presents for follow-up to chronic hypertension, at last visit he was prescribed lisinopril-HCTZ, dose this for a few weeks but self discontinued due to feeling depressed mood.  He did restart this medication recently after seeing home blood pressure readings significantly elevated.  He denies any symptoms.  He is amenable to restart an alternate blood pressure medication.  He did not give an opportunity to obtain labs as of yet but plans to do so.  Cardiopulmonary findings benign today, blood pressure numbers are improved though still elevated.  We will transition to valsartan hydrochlorothiazide 80-12.5 mg daily, advised patient to pursue fasting labs, and maintain close follow-up.       Relevant Medications   valsartan-hydrochlorothiazide (DIOVAN-HCT) 80-12.5 MG tablet     Musculoskeletal and Integument   Rotator cuff dysfunction, right    Right-hand-dominant patient presenting with acute on  chronic right shoulder symptoms.  He states a few years back after a fall he had significant pain and dysfunction, did have x-ray which was negative, this issue resolved following NSAIDs, pain medication, and ramp up and exercise.  Symptoms have been relatively well controlled since that time but he does state over the past week he noticed significant progressive pain after extensive session of overhead tree trimming followed by athletics.  He awoke with an inability to raise his arm, finding positions of comfort, and pain.  Examination shows inability to raise arm actively above the level of the shoulder, rotator cuff testing reveals 4 -/5 strength with external rotation associated pain, 3/5 strength with isolated supraspinatus testing, 5/5 strength with internal rotation.  Contralateral side is benign.  Equivocal Neer's, positive Hawkins, remainder of examination benign.  Concern for acute on chronic rotator cuff injury with noted dysfunction throughout the supraspinatus and external rotators, did discuss surgical and nonsurgical treatments and he is wishing to proceed in a nonsurgical manner.  Plan for scheduled diclofenac, as needed methocarbamol, activity restrictions, and follow-up in 2 weeks.  If suboptimal progress noted, can consider advanced imaging versus ultrasound-guided injection.       Relevant Medications   diclofenac (VOLTAREN) 75 MG EC tablet   methocarbamol (ROBAXIN) 500 MG tablet     Orders & Medications Meds ordered this encounter  Medications   diclofenac (VOLTAREN) 75 MG EC tablet    Sig: Take 1 tablet (75  mg total) by mouth 2 (two) times daily.    Dispense:  30 tablet    Refill:  0   methocarbamol (ROBAXIN) 500 MG tablet    Sig: Take 1 tablet (500 mg total) by mouth at bedtime as needed for muscle spasms.    Dispense:  14 tablet    Refill:  0   valsartan-hydrochlorothiazide (DIOVAN-HCT) 80-12.5 MG tablet    Sig: Take 1 tablet by mouth daily.    Dispense:  30 tablet     Refill:  0   No orders of the defined types were placed in this encounter.    No follow-ups on file.     Jerrol Banana, MD   Primary Care Sports Medicine [facility]

## 2021-09-28 NOTE — Assessment & Plan Note (Signed)
Patient presents for follow-up to chronic hypertension, at last visit he was prescribed lisinopril-HCTZ, dose this for a few weeks but self discontinued due to feeling depressed mood.  He did restart this medication recently after seeing home blood pressure readings significantly elevated.  He denies any symptoms.  He is amenable to restart an alternate blood pressure medication.  He did not give an opportunity to obtain labs as of yet but plans to do so.  Cardiopulmonary findings benign today, blood pressure numbers are improved though still elevated.  We will transition to valsartan hydrochlorothiazide 80-12.5 mg daily, advised patient to pursue fasting labs, and maintain close follow-up.

## 2021-10-02 ENCOUNTER — Ambulatory Visit: Payer: Self-pay | Admitting: *Deleted

## 2021-10-02 ENCOUNTER — Ambulatory Visit
Admission: RE | Admit: 2021-10-02 | Discharge: 2021-10-02 | Disposition: A | Discharge status: Home / Self Care | Payer: BC Managed Care – PPO | Attending: Family Medicine | Admitting: Family Medicine

## 2021-10-02 ENCOUNTER — Ambulatory Visit
Admission: RE | Admit: 2021-10-02 | Discharge: 2021-10-02 | Disposition: A | Discharge status: Home / Self Care | Payer: BC Managed Care – PPO | Source: Ambulatory Visit | Attending: Family Medicine | Admitting: Family Medicine

## 2021-10-02 ENCOUNTER — Inpatient Hospital Stay (INDEPENDENT_AMBULATORY_CARE_PROVIDER_SITE_OTHER): Payer: BC Managed Care – PPO | Admitting: Radiology

## 2021-10-02 ENCOUNTER — Encounter: Payer: Self-pay | Admitting: Family Medicine

## 2021-10-02 ENCOUNTER — Ambulatory Visit: Payer: BC Managed Care – PPO | Admitting: Family Medicine

## 2021-10-02 VITALS — BP 150/98 | HR 93 | Ht 68.0 in | Wt 206.0 lb

## 2021-10-02 DIAGNOSIS — M67911 Unspecified disorder of synovium and tendon, right shoulder: Secondary | ICD-10-CM | POA: Insufficient documentation

## 2021-10-02 DIAGNOSIS — R531 Weakness: Secondary | ICD-10-CM | POA: Diagnosis not present

## 2021-10-02 DIAGNOSIS — M19011 Primary osteoarthritis, right shoulder: Secondary | ICD-10-CM | POA: Diagnosis not present

## 2021-10-02 NOTE — Telephone Encounter (Signed)
Summary: no strength in bicep   Pt saw Dr Ashley Royalty for shoulder pain.  Pt states he has no strength in his bicep.  Pt states something about a "3 week window" to have something done if there is something wrong, and it has already been 2 weeks.  Pt has appt 6/16, but again states that will be over the 3 week "window" and he wanted to know what he should do or what Dr Ashley Royalty would have him do?      Reason for Disposition  Can't move injured shoulder normally (e.g., full range of motion, able to touch top of head)  Answer Assessment - Initial Assessment Questions 1. MECHANISM: "How did the injury happen?"      Chronic injury- patient is concerned about time line about  2. WHEN: "When did the injury happen?" (Minutes or hours ago)      R arm pop when arm wrestling- several months ago 3. LOCATION: "Which shoulder is injured?"     R shoulder/arm 4. APPEARANCE of INJURY: "What does the injury look like?"      No injury 5. SEVERITY: "Can your child move the shoulder normally?" (Can reach up to the sky, out to the side)      *No Answer* 6. SIZE: For bruises or swelling, ask: "How large is it?" (Inches or centimeters)      *No Answer* 7. PAIN: "Is there pain?" If so, ask: "How bad is the pain?"      *No Answer* 8. TETANUS: For any breaks in the skin, ask: "When was the last tetanus booster?"     *No Answer*  Answer Assessment - Initial Assessment Questions 1. MECHANISM: "How did the injury happen?"     Chronic- several months ago 2. ONSET: "When did the injury happen?" (Minutes or hours ago)      Patient is not sure has physical job- but is recalling he was arm wrestling several months ago and did hear pop- didn't think anything about it because no pain 3. APPEARANCE of INJURY: "What does the injury look like?"      Unable to lift arm properly for activities of daily living- decreased arm strength 4. SEVERITY: "Can you move the shoulder normally?"      no 5. SIZE: For cuts, bruises, or  swelling, ask: "How large is it?" (e.g., inches or centimeters;  entire joint)      none 6. PAIN: "Is there pain?" If Yes, ask: "How bad is the pain?"   (e.g., Scale 1-10; or mild, moderate, severe)   - MILD (1-3): doesn't interfere with normal activities   - MODERATE (4-7): interferes with normal activities (e.g., work or school) or awakens from sleep   - SEVERE (8-10): excruciating pain, unable to do any normal activities, unable to move arm at all due to pain     none 7. TETANUS: For any breaks in the skin, ask: "When was the last tetanus booster?"     na 8. OTHER SYMPTOMS: "Do you have any other symptoms?" (e.g., loss of sensation)     Weakness, loss of strength 9. PREGNANCY: "Is there any chance you are pregnant?" "When was your last menstrual period?"  Protocols used: Shoulder Injury-P-AH, Shoulder Injury-A-AH

## 2021-10-02 NOTE — Assessment & Plan Note (Signed)
Patient presents for unscheduled follow-up to acute on chronic right shoulder pain in the setting of concern for rotator cuff dysfunction.  He states that he has noted further decrease in function primarily with abduction and forward flexion citing issues with his bicep specifically, denies any ecchymosis, no deformations, but has noted increased weakness.  He does bring up an episode roughly 2-3 months prior, while arm wrestling, where there was a loud pop followed by minor pain, no deformation or bruising at that time either.  He denies any significant dysfunction following that until most recent presentation.  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  Examination today reveals no abnormalities to inspection comparing the upper extremities, mildly diminished tone with palpation throughout the bicep, no tenderness at the bicipital groove, negative hook sign on the right, weakness during resisted supination when compared to contralateral as well as elbow flexion, rotator cuff testing reveals 4 -/5 strength again noted with external rotation and 3/5 strength with isolated supersized testing, 5/5 strength with internal rotation.  We did discuss concern over rotator cuff tear, surgical and nonsurgical treatments, did evaluate the right shoulder with ultrasound and perform a diagnostic lidocaine-bupivacaine only injection the subacromial space which was followed by 3+/5 strength Lysteda services testing, otherwise examination stable.  Plan for MRI without contrast of the right shoulder scheduled staffed, proceeding x-ray, as needed dosing of diclofenac.

## 2021-10-02 NOTE — Telephone Encounter (Signed)
  Chief Complaint: patient has concerns about his R shoulder/arm Symptoms: decreased strength, unable to lift arm to do activities of daily living Frequency: chronic- several months Pertinent Negatives: Patient denies pain Disposition: [] ED /[] Urgent Care (no appt availability in office) / [x] Appointment(In office/virtual)/ []  [facility]/ [] Home Care/ [] Refused Recommended Disposition /[] [facility]/ []  Follow-up with PCP Additional Notes:

## 2021-10-02 NOTE — Progress Notes (Signed)
Primary Care / Sports Medicine Office Visit  Patient Information:  Patient ID: Bob Ryan, male DOB: 1975-09-29 Age: 46 y.o. MRN: [id number]   Bob Ryan is a pleasant 46 y.o. male presenting with the following:  Chief Complaint  Patient presents with   Shoulder Pain    Right shoulder, no pain unable to move it    Vitals:   10/02/21 1315  BP: (!) 150/98  Pulse: 93  SpO2: 98%   Vitals:   10/02/21 1315  Weight: 206 lb (93.4 kg)  Height: 5\' 8"  (1.727 m)   Body mass index is 31.32 kg/m.  No results found.   Independent interpretation of notes and tests performed by another provider:   None  Procedures performed:   Procedure:  Injection of right subacromial space under ultrasound guidance. Ultrasound guidance utilized to visualize the biceps tendon situated in the bicipital groove, there is thickening and loss of the order distribution of tendon fibers raising concern for intrasubstance tear, supraspinatus tendon with intrasubstance hypoechoic regions again raising concern for tendinopathy. Samsung HS60 device utilized with permanent recording / reporting. Verbal informed consent obtained and verified. Skin prepped in a sterile fashion. Ethyl chloride for topical local analgesia.  Completed without difficulty and tolerated well. Medication:  3 mL lidocaine 1% without epinephrine utilized for needle placement anesthetic Advised to contact for fevers/chills, erythema, induration, drainage, or persistent bleeding.   Pertinent History, Exam, Impression, and Recommendations:   Problem List Items Addressed This Visit       Musculoskeletal and Integument   Rotator cuff dysfunction, right - Primary    Patient presents for unscheduled follow-up to acute on chronic right shoulder pain in the setting of concern for rotator cuff dysfunction.  He states that he has noted further decrease in function primarily with abduction and forward flexion citing issues with his  bicep specifically, denies any ecchymosis, no deformations, but has noted increased weakness.  He does bring up an episode roughly 2-3 months prior, while arm wrestling, where there was a loud pop followed by minor pain, no deformation or bruising at that time either.  He denies any significant dysfunction following that until most recent presentation.  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  Examination today reveals no abnormalities to inspection comparing the upper extremities, mildly diminished tone with palpation throughout the bicep, no tenderness at the bicipital groove, negative hook sign on the right, weakness during resisted supination when compared to contralateral as well as elbow flexion, rotator cuff testing reveals 4 -/5 strength again noted with external rotation and 3/5 strength with isolated supersized testing, 5/5 strength with internal rotation.  We did discuss concern over rotator cuff tear, surgical and nonsurgical treatments, did evaluate the right shoulder with ultrasound and perform a diagnostic lidocaine-bupivacaine only injection the subacromial space which was followed by 3+/5 strength Lysteda services testing, otherwise examination stable.  Plan for MRI without contrast of the right shoulder scheduled staffed, proceeding x-ray, as needed dosing of diclofenac.       Relevant Orders   [place] LIMITED JOINT SPACE STRUCTURES UP RIGHT   MR Shoulder Right Wo Contrast   DG Shoulder Right     Orders & Medications No orders of the defined types were placed in this encounter.  Orders Placed This Encounter  Procedures   [place] LIMITED JOINT SPACE STRUCTURES UP RIGHT   MR Shoulder Right Wo Contrast  DG Shoulder Right     No follow-ups on file.     Montel Culver, MD   Primary Care Sports Medicine [place]

## 2021-10-02 NOTE — Patient Instructions (Signed)
-   Proceed with x-ray and MRI - Hold from strenuous activity until MRI reviewed - Can dose diclofenac as-needed up to twice daily - Contact for questions

## 2021-10-04 ENCOUNTER — Other Ambulatory Visit (HOSPITAL_COMMUNITY): Payer: Self-pay | Admitting: Dermatology

## 2021-10-04 ENCOUNTER — Other Ambulatory Visit: Payer: Self-pay

## 2021-10-04 DIAGNOSIS — S00259A Superficial foreign body of unspecified eyelid and periocular area, initial encounter: Secondary | ICD-10-CM

## 2021-10-09 ENCOUNTER — Ambulatory Visit
Admission: RE | Admit: 2021-10-09 | Discharge: 2021-10-09 | Disposition: A | Discharge status: Home / Self Care | Payer: BC Managed Care – PPO | Source: Ambulatory Visit | Attending: Family Medicine | Admitting: Family Medicine

## 2021-10-09 ENCOUNTER — Other Ambulatory Visit: Payer: Self-pay

## 2021-10-09 ENCOUNTER — Telehealth: Payer: Self-pay | Admitting: Family Medicine

## 2021-10-09 DIAGNOSIS — M67911 Unspecified disorder of synovium and tendon, right shoulder: Secondary | ICD-10-CM

## 2021-10-09 DIAGNOSIS — M75101 Unspecified rotator cuff tear or rupture of right shoulder, not specified as traumatic: Secondary | ICD-10-CM

## 2021-10-09 DIAGNOSIS — S00259A Superficial foreign body of unspecified eyelid and periocular area, initial encounter: Secondary | ICD-10-CM | POA: Diagnosis not present

## 2021-10-09 DIAGNOSIS — Z135 Encounter for screening for eye and ear disorders: Secondary | ICD-10-CM | POA: Diagnosis not present

## 2021-10-09 DIAGNOSIS — M25511 Pain in right shoulder: Secondary | ICD-10-CM | POA: Diagnosis not present

## 2021-10-09 NOTE — Progress Notes (Signed)
Referral placed Pt called in and stated the name of the orthopedic surgeon he would like to see is Dr Fay Records at [facility].

## 2021-10-09 NOTE — Telephone Encounter (Signed)
Copied from [place] [id number]. Topic: General - Other >> Oct 09, 2021  9:37 AM Rudene Anda wrote: Reason for CRM: Pt called in and stated the name of the orthopedic surgeon he would like to see is Dr Christella Hartigan at [facility]. FYI

## 2021-10-10 DIAGNOSIS — Z13220 Encounter for screening for lipoid disorders: Secondary | ICD-10-CM | POA: Diagnosis not present

## 2021-10-10 DIAGNOSIS — I10 Essential (primary) hypertension: Secondary | ICD-10-CM | POA: Diagnosis not present

## 2021-10-11 ENCOUNTER — Other Ambulatory Visit: Payer: Self-pay | Admitting: Family Medicine

## 2021-10-11 ENCOUNTER — Other Ambulatory Visit: Payer: Self-pay

## 2021-10-11 DIAGNOSIS — E782 Mixed hyperlipidemia: Secondary | ICD-10-CM

## 2021-10-11 LAB — CBC
Hematocrit: 52.5 % — ABNORMAL HIGH (ref 37.5–51.0)
Hemoglobin: 18.4 g/dL — ABNORMAL HIGH (ref 13.0–17.7)
MCH: 30.8 pg (ref 26.6–33.0)
MCHC: 35 g/dL (ref 31.5–35.7)
MCV: 88 fL (ref 79–97)
Platelets: 281 10*3/uL (ref 150–450)
RBC: 5.97 x10E6/uL — ABNORMAL HIGH (ref 4.14–5.80)
RDW: 13.5 % (ref 11.6–15.4)
WBC: 7.3 10*3/uL (ref 3.4–10.8)

## 2021-10-11 LAB — COMPREHENSIVE METABOLIC PANEL
ALT: 67 IU/L — ABNORMAL HIGH (ref 0–44)
AST: 49 IU/L — ABNORMAL HIGH (ref 0–40)
Albumin/Globulin Ratio: 1.9 (ref 1.2–2.2)
Albumin: 4.7 g/dL (ref 4.0–5.0)
Alkaline Phosphatase: 78 IU/L (ref 44–121)
BUN/Creatinine Ratio: 11 (ref 9–20)
BUN: 12 mg/dL (ref 6–24)
Bilirubin Total: 0.8 mg/dL (ref 0.0–1.2)
CO2: 26 mmol/L (ref 20–29)
Calcium: 9.5 mg/dL (ref 8.7–10.2)
Chloride: 97 mmol/L (ref 96–106)
Creatinine, Ser: 1.13 mg/dL (ref 0.76–1.27)
Globulin, Total: 2.5 g/dL (ref 1.5–4.5)
Glucose: 93 mg/dL (ref 70–99)
Potassium: 4.7 mmol/L (ref 3.5–5.2)
Sodium: 137 mmol/L (ref 134–144)
Total Protein: 7.2 g/dL (ref 6.0–8.5)
eGFR: 81 mL/min/{1.73_m2} (ref >59)

## 2021-10-11 LAB — LIPID PANEL
Chol/HDL Ratio: 7 ratio — ABNORMAL HIGH (ref 0.0–5.0)
Cholesterol, Total: 232 mg/dL — ABNORMAL HIGH (ref 100–199)
HDL: 33 mg/dL — ABNORMAL LOW (ref >39)
LDL Chol Calc (NIH): 161 mg/dL — ABNORMAL HIGH (ref 0–99)
Triglycerides: 203 mg/dL — ABNORMAL HIGH (ref 0–149)
VLDL Cholesterol Cal: 38 mg/dL (ref 5–40)

## 2021-10-11 LAB — TSH: TSH: 2.1 u[IU]/mL (ref 0.450–4.500)

## 2021-10-11 LAB — APO A1 + B + RATIO
Apolipo. B/A-1 Ratio: 1.4 ratio — ABNORMAL HIGH (ref 0.0–0.7)
Apolipoprotein A-1: 110 mg/dL (ref 101–178)
Apolipoprotein B: 152 mg/dL — ABNORMAL HIGH (ref <90)

## 2021-10-11 MED ORDER — ROSUVASTATIN CALCIUM 20 MG PO TABS: 20.0000 mg | ORAL_TABLET | Freq: Every day | ORAL | 3 refills | Status: DC

## 2021-10-11 NOTE — Progress Notes (Signed)
Labs ordered  -[employer]

## 2021-10-12 ENCOUNTER — Ambulatory Visit: Payer: BC Managed Care – PPO | Admitting: Family Medicine

## 2021-10-15 DIAGNOSIS — M7541 Impingement syndrome of right shoulder: Secondary | ICD-10-CM | POA: Diagnosis not present

## 2021-10-15 DIAGNOSIS — Z01818 Encounter for other preprocedural examination: Secondary | ICD-10-CM | POA: Diagnosis not present

## 2021-10-15 DIAGNOSIS — M7521 Bicipital tendinitis, right shoulder: Secondary | ICD-10-CM | POA: Diagnosis not present

## 2021-10-15 DIAGNOSIS — S46011A Strain of muscle(s) and tendon(s) of the rotator cuff of right shoulder, initial encounter: Secondary | ICD-10-CM | POA: Diagnosis not present

## 2021-10-23 DIAGNOSIS — M75121 Complete rotator cuff tear or rupture of right shoulder, not specified as traumatic: Secondary | ICD-10-CM | POA: Diagnosis not present

## 2021-10-23 DIAGNOSIS — M65811 Other synovitis and tenosynovitis, right shoulder: Secondary | ICD-10-CM | POA: Diagnosis not present

## 2021-10-23 DIAGNOSIS — M25511 Pain in right shoulder: Secondary | ICD-10-CM | POA: Diagnosis not present

## 2021-10-23 DIAGNOSIS — M7521 Bicipital tendinitis, right shoulder: Secondary | ICD-10-CM | POA: Diagnosis not present

## 2021-10-23 DIAGNOSIS — M7541 Impingement syndrome of right shoulder: Secondary | ICD-10-CM | POA: Diagnosis not present

## 2021-10-23 DIAGNOSIS — M659 Synovitis and tenosynovitis, unspecified: Secondary | ICD-10-CM | POA: Diagnosis not present

## 2021-10-23 DIAGNOSIS — G8918 Other acute postprocedural pain: Secondary | ICD-10-CM | POA: Diagnosis not present

## 2021-10-23 DIAGNOSIS — S46011A Strain of muscle(s) and tendon(s) of the rotator cuff of right shoulder, initial encounter: Secondary | ICD-10-CM | POA: Diagnosis not present

## 2021-10-23 DIAGNOSIS — M75101 Unspecified rotator cuff tear or rupture of right shoulder, not specified as traumatic: Secondary | ICD-10-CM | POA: Diagnosis not present

## 2021-10-25 ENCOUNTER — Other Ambulatory Visit: Payer: Self-pay | Admitting: Family Medicine

## 2021-10-25 DIAGNOSIS — I10 Essential (primary) hypertension: Secondary | ICD-10-CM

## 2021-10-25 NOTE — Telephone Encounter (Signed)
Requested Prescriptions  Pending Prescriptions Disp Refills  . valsartan-hydrochlorothiazide (DIOVAN-HCT) 80-12.5 MG tablet [Pharmacy Med Name: VALSARTAN-HCTZ 80-12.5 MG TAB] 90 tablet 0    Sig: TAKE 1 TABLET BY MOUTH EVERY DAY     Cardiovascular: ARB + Diuretic Combos Failed - 10/25/2021  1:54 AM      Failed - Last BP in normal range    BP Readings from Last 1 Encounters:  10/02/21 (!) 150/98         Passed - K in normal range and within 180 days    Potassium  Date Value Ref Range Status  10/10/2021 4.7 3.5 - 5.2 mmol/L Final         Passed - Na in normal range and within 180 days    Sodium  Date Value Ref Range Status  10/10/2021 137 134 - 144 mmol/L Final         Passed - Cr in normal range and within 180 days    Creatinine, Ser  Date Value Ref Range Status  10/10/2021 1.13 0.76 - 1.27 mg/dL Final         Passed - eGFR is 10 or above and within 180 days    eGFR  Date Value Ref Range Status  10/10/2021 81 >59 mL/min/1.73 Final         Passed - Patient is not pregnant      Passed - Valid encounter within last 6 months    Recent Outpatient Visits          3 weeks ago Rotator cuff dysfunction, right   [facility] Montel Culver, MD   3 weeks ago Hypertension, unspecified type   [facility] Montel Culver, MD   1 month ago Hypertension, unspecified type   [facility] Montel Culver, MD

## 2021-11-06 DIAGNOSIS — M6281 Muscle weakness (generalized): Secondary | ICD-10-CM | POA: Diagnosis not present

## 2021-11-06 DIAGNOSIS — M25511 Pain in right shoulder: Secondary | ICD-10-CM | POA: Diagnosis not present

## 2021-11-15 DIAGNOSIS — M25511 Pain in right shoulder: Secondary | ICD-10-CM | POA: Diagnosis not present

## 2021-11-15 DIAGNOSIS — M6281 Muscle weakness (generalized): Secondary | ICD-10-CM | POA: Diagnosis not present

## 2021-11-28 DIAGNOSIS — M25511 Pain in right shoulder: Secondary | ICD-10-CM | POA: Diagnosis not present

## 2021-11-28 DIAGNOSIS — M6281 Muscle weakness (generalized): Secondary | ICD-10-CM | POA: Diagnosis not present

## 2021-12-05 DIAGNOSIS — M25511 Pain in right shoulder: Secondary | ICD-10-CM | POA: Diagnosis not present

## 2021-12-05 DIAGNOSIS — M6281 Muscle weakness (generalized): Secondary | ICD-10-CM | POA: Diagnosis not present

## 2021-12-10 DIAGNOSIS — M25511 Pain in right shoulder: Secondary | ICD-10-CM | POA: Diagnosis not present

## 2021-12-10 DIAGNOSIS — M6281 Muscle weakness (generalized): Secondary | ICD-10-CM | POA: Diagnosis not present

## 2021-12-17 DIAGNOSIS — M25511 Pain in right shoulder: Secondary | ICD-10-CM | POA: Diagnosis not present

## 2021-12-17 DIAGNOSIS — M6281 Muscle weakness (generalized): Secondary | ICD-10-CM | POA: Diagnosis not present

## 2021-12-24 DIAGNOSIS — M6281 Muscle weakness (generalized): Secondary | ICD-10-CM | POA: Diagnosis not present

## 2021-12-24 DIAGNOSIS — M25511 Pain in right shoulder: Secondary | ICD-10-CM | POA: Diagnosis not present

## 2022-01-02 DIAGNOSIS — M25511 Pain in right shoulder: Secondary | ICD-10-CM | POA: Diagnosis not present

## 2022-01-02 DIAGNOSIS — M6281 Muscle weakness (generalized): Secondary | ICD-10-CM | POA: Diagnosis not present

## 2022-03-14 ENCOUNTER — Other Ambulatory Visit: Payer: Self-pay

## 2022-03-14 ENCOUNTER — Encounter: Payer: Self-pay | Admitting: Family Medicine

## 2022-03-14 ENCOUNTER — Ambulatory Visit
Admission: RE | Admit: 2022-03-14 | Discharge: 2022-03-14 | Disposition: A | Discharge status: Home / Self Care | Payer: BC Managed Care – PPO | Source: Ambulatory Visit | Attending: Family Medicine | Admitting: Family Medicine

## 2022-03-14 ENCOUNTER — Ambulatory Visit (INDEPENDENT_AMBULATORY_CARE_PROVIDER_SITE_OTHER): Payer: BC Managed Care – PPO | Admitting: Family Medicine

## 2022-03-14 ENCOUNTER — Ambulatory Visit
Admission: RE | Admit: 2022-03-14 | Discharge: 2022-03-14 | Disposition: A | Discharge status: Home / Self Care | Payer: BC Managed Care – PPO | Attending: Family Medicine | Admitting: Family Medicine

## 2022-03-14 ENCOUNTER — Telehealth: Payer: Self-pay

## 2022-03-14 VITALS — BP 140/110 | HR 72 | Ht 68.0 in | Wt 206.0 lb

## 2022-03-14 DIAGNOSIS — S90851A Superficial foreign body, right foot, initial encounter: Secondary | ICD-10-CM

## 2022-03-14 DIAGNOSIS — G8929 Other chronic pain: Secondary | ICD-10-CM

## 2022-03-14 DIAGNOSIS — M7989 Other specified soft tissue disorders: Secondary | ICD-10-CM | POA: Diagnosis not present

## 2022-03-14 DIAGNOSIS — I10 Essential (primary) hypertension: Secondary | ICD-10-CM

## 2022-03-14 DIAGNOSIS — Z1211 Encounter for screening for malignant neoplasm of colon: Secondary | ICD-10-CM

## 2022-03-14 DIAGNOSIS — E782 Mixed hyperlipidemia: Secondary | ICD-10-CM | POA: Insufficient documentation

## 2022-03-14 DIAGNOSIS — M25532 Pain in left wrist: Secondary | ICD-10-CM | POA: Diagnosis not present

## 2022-03-14 DIAGNOSIS — M19071 Primary osteoarthritis, right ankle and foot: Secondary | ICD-10-CM | POA: Diagnosis not present

## 2022-03-14 DIAGNOSIS — M19032 Primary osteoarthritis, left wrist: Secondary | ICD-10-CM | POA: Diagnosis not present

## 2022-03-14 DIAGNOSIS — M7731 Calcaneal spur, right foot: Secondary | ICD-10-CM | POA: Diagnosis not present

## 2022-03-14 DIAGNOSIS — M19132 Post-traumatic osteoarthritis, left wrist: Secondary | ICD-10-CM | POA: Insufficient documentation

## 2022-03-14 MED ORDER — ROSUVASTATIN CALCIUM 20 MG PO TABS: 20.0000 mg | ORAL_TABLET | Freq: Every day | ORAL | 3 refills | Status: DC

## 2022-03-14 MED ORDER — VALSARTAN-HYDROCHLOROTHIAZIDE 80-12.5 MG PO TABS: 1.0000 | ORAL_TABLET | Freq: Every day | ORAL | 1 refills | Status: DC

## 2022-03-14 MED ORDER — DULOXETINE HCL 30 MG PO CPEP: 30.0000 mg | ORAL_CAPSULE | Freq: Every day | ORAL | 1 refills | Status: DC

## 2022-03-14 NOTE — Assessment & Plan Note (Signed)
Referral for screening colonoscopy placed today. 

## 2022-03-14 NOTE — Assessment & Plan Note (Signed)
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  He has noted progression of his pain over the past year without any additional trauma, swelling to the dorsal radial aspect of his wrist.  Denies any paresthesias, pain with ADLs.  Examination reveals swollen region at the dorsal radial aspect of his wrist, tenderness, resisted extension worsens pain, he does have roughly 5-10 degrees of extension deficit when compared to contralateral, sensorimotor is intact, negative Finkelstein's, no shift with carpal stressing, negative TFCC grind.  Findings most correlate with extensor tendinopathy confounded by concern for prior osseous injury which will be further evaluated with dedicated x-rays.  Due to comorbid hypertension will refrain from oral NSAID, trial topical NSAID, cock-up wrist brace, review x-rays.  We will also initiate duloxetine in effort to minimize NSAID usage long-term (currently dosing 600 mg ibuprofen daily) and coordinate follow-up in 4 weeks.

## 2022-03-14 NOTE — Telephone Encounter (Signed)
Gastroenterology Pre-Procedure Review  Request Date: 05/06/22 Requesting Physician: Dr. Servando Snare  PATIENT REVIEW QUESTIONS: The patient responded to the following health history questions as indicated:    1. Are you having any GI issues? no 2. Do you have a personal history of Polyps? no 3. Do you have a family history of Colon Cancer or Polyps? no 4. Diabetes Mellitus? no 5. Joint replacements in the past 12 months? Shoulder surgery 10/23/21 6. Major health problems in the past 3 months?no 7. Any artificial heart valves, MVP, or defibrillator?no    MEDICATIONS & ALLERGIES:    Patient reports the following regarding taking any anticoagulation/antiplatelet therapy:   Plavix, Coumadin, Eliquis, Xarelto, Lovenox, Pradaxa, Brilinta, or Effient? no Aspirin? no  Patient confirms/reports the following medications:  Current Outpatient Medications  Medication Sig Dispense Refill   DULoxetine (CYMBALTA) 30 MG capsule Take 1 capsule (30 mg total) by mouth daily. 30 capsule 1   rosuvastatin (CRESTOR) 20 MG tablet Take 1 tablet (20 mg total) by mouth daily. 90 tablet 3   valsartan-hydrochlorothiazide (DIOVAN-HCT) 80-12.5 MG tablet Take 1 tablet by mouth daily. 90 tablet 1   No current facility-administered medications for this visit.    Patient confirms/reports the following allergies:  No Known Allergies  No orders of the defined types were placed in this encounter.   AUTHORIZATION INFORMATION Primary Insurance: 1D#: Group #:  Secondary Insurance: 1D#: Group #:  SCHEDULE INFORMATION: Date: 05/06/22 Time: Location: [facility]

## 2022-03-14 NOTE — Progress Notes (Signed)
Primary Care / Sports Medicine Office Visit  Patient Information:  Patient ID: Bob Ryan, male DOB: 10/13/1975 Age: 46 y.o. MRN: [id number]   Bob Ryan is a pleasant 46 y.o. male presenting with the following:  Chief Complaint  Patient presents with   Wrist Pain    Knot on wrist for years, sprained it 20+ years ago and states it has started to get stiff and painful   Foreign Body in Skin    Right foot since April    Vitals:   03/14/22 0803 03/14/22 0806  BP: (!) 138/102 (!) 140/110  Pulse: 72   SpO2: 99%    Vitals:   03/14/22 0803  Weight: 206 lb (93.4 kg)  Height: 5\' 8"  (1.727 m)   Body mass index is 31.32 kg/m.  No results found.   Independent interpretation of notes and tests performed by another provider:   None  Procedures performed:   None  Pertinent History, Exam, Impression, and Recommendations:   Problem List Items Addressed This Visit       Cardiovascular and Mediastinum   Hypertension    Chronic condition, without adequate control, patient has not been dosing his medications citing forgetfulness.  He denies any cardiopulmonary complaints.  Plan for restart of medications at same dose, close follow-up in 1 month, referral to cardiology placed for further risk ratification and medication management.      Relevant Medications   rosuvastatin (CRESTOR) 20 MG tablet   valsartan-hydrochlorothiazide (DIOVAN-HCT) 80-12.5 MG tablet   Other Relevant Orders   Ambulatory referral to Cardiology     Musculoskeletal and Integument   Foreign body of skin of plantar aspect of right foot    Patient describes foreign body at right plantar foot near the heel, described as a splinter, ongoing since 07/2021.  Painful steps that eases with increased motion, first up out of bed is worst in motion after period of immobility.  Denies radiation of symptoms, noted discharge/drainage.  Examination reveals verrucous roughly 2 x 2 circular region just distal to  the calcaneus, acutely tender, recurrent symptoms, no clearly evident foreign body visualized, calcaneus itself is essentially nontender, does have tightness throughout the plantar fascia and Achilles.  Clinical history and findings are most consistent with possible foreign body, superimposed plantar verruca, cannot exclude element of plantar fascial involvement as well.  We will refer to podiatry for further evaluation and management, x-ray ordered to further evaluate calcaneus, plantar fascia, radiopaque foreign bodies.      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  He has noted progression of his pain over the past year without any additional trauma, swelling to the dorsal radial aspect of his wrist.  Denies any paresthesias, pain with ADLs.  Examination reveals swollen region at the dorsal radial aspect of his wrist, tenderness, resisted extension worsens pain, he does have roughly 5-10 degrees of extension deficit when compared to contralateral, sensorimotor is intact, negative Finkelstein's, no shift with carpal stressing, negative TFCC grind.  Findings most correlate with extensor  tendinopathy confounded by concern for prior osseous injury which will be further evaluated with dedicated x-rays.  Due to comorbid hypertension will refrain from oral NSAID, trial topical NSAID, cock-up wrist brace, review x-rays.  We will also initiate duloxetine in effort to minimize NSAID usage long-term (currently dosing 600 mg ibuprofen daily) and  coordinate follow-up in 4 weeks.      Relevant Medications   DULoxetine (CYMBALTA) 30 MG capsule   Other Relevant Orders   DG Wrist Complete Left   Colon cancer screening    Referral for screening colonoscopy placed today.      Relevant Orders   Ambulatory referral to Gastroenterology   Mixed hyperlipidemia   Relevant Medications   rosuvastatin (CRESTOR) 20 MG tablet   valsartan-hydrochlorothiazide (DIOVAN-HCT) 80-12.5 MG tablet     Orders & Medications Meds ordered this encounter  Medications   rosuvastatin (CRESTOR) 20 MG tablet    Sig: Take 1 tablet (20 mg total) by mouth daily.    Dispense:  90 tablet    Refill:  3   valsartan-hydrochlorothiazide (DIOVAN-HCT) 80-12.5 MG tablet    Sig: Take 1 tablet by mouth daily.    Dispense:  90 tablet    Refill:  1   DULoxetine (CYMBALTA) 30 MG capsule    Sig: Take 1 capsule (30 mg total) by mouth daily.    Dispense:  30 capsule    Refill:  1   Orders Placed This Encounter  Procedures   DG Foot Complete Right   DG Wrist Complete Left   Ambulatory referral to Gastroenterology   Ambulatory referral to Cardiology   Ambulatory referral to Podiatry     Return in about 4 weeks (around 04/11/2022).     Jerrol Banana, MD   Primary Care Sports Medicine [facility]

## 2022-03-14 NOTE — Assessment & Plan Note (Signed)
>>  ASSESSMENT AND PLAN FOR COLON CANCER SCREENING WRITTEN ON 03/14/2022  9:41 AM BY Neko Mcgeehan, Earley Abide, MD  Referral for screening colonoscopy placed today.

## 2022-03-14 NOTE — Assessment & Plan Note (Signed)
Chronic condition, without adequate control, patient has not been dosing his medications citing forgetfulness.  He denies any cardiopulmonary complaints.  Plan for restart of medications at same dose, close follow-up in 1 month, referral to cardiology placed for further risk ratification and medication management.

## 2022-03-14 NOTE — Assessment & Plan Note (Signed)
Patient describes foreign body at right plantar foot near the heel, described as a splinter, ongoing since 07/2021.  Painful steps that eases with increased motion, first up out of bed is worst in motion after period of immobility.  Denies radiation of symptoms, noted discharge/drainage.  Examination reveals verrucous roughly 2 x 2 circular region just distal to the calcaneus, acutely tender, recurrent symptoms, no clearly evident foreign body visualized, calcaneus itself is essentially nontender, does have tightness throughout the plantar fascia and Achilles.  Clinical history and findings are most consistent with possible foreign body, superimposed plantar verruca, cannot exclude element of plantar fascial involvement as well.  We will refer to podiatry for further evaluation and management, x-ray ordered to further evaluate calcaneus, plantar fascia, radiopaque foreign bodies.

## 2022-03-14 NOTE — Patient Instructions (Signed)
-   Obtain x-rays today - Wear left wrist brace throughout the day, okay to remove at bedtime/for hand hygiene - Apply topical anti-inflammatory Voltaren gel 2-4 times daily scheduled - Start duloxetine (Cymbalta) daily - Restart blood pressure and cholesterol medications daily - Coordinator will contact you to schedule visits with podiatry, cardiology, and gastroenterology - Return for follow-up in 4 weeks - Contact us for any questions between now and then

## 2022-03-18 DIAGNOSIS — S46011D Strain of muscle(s) and tendon(s) of the rotator cuff of right shoulder, subsequent encounter: Secondary | ICD-10-CM | POA: Diagnosis not present

## 2022-03-27 ENCOUNTER — Ambulatory Visit: Payer: BC Managed Care – PPO

## 2022-03-27 ENCOUNTER — Ambulatory Visit (INDEPENDENT_AMBULATORY_CARE_PROVIDER_SITE_OTHER): Payer: BC Managed Care – PPO | Admitting: Podiatry

## 2022-03-27 ENCOUNTER — Encounter: Payer: Self-pay | Admitting: Podiatry

## 2022-03-27 DIAGNOSIS — S90851A Superficial foreign body, right foot, initial encounter: Secondary | ICD-10-CM

## 2022-03-27 NOTE — Progress Notes (Signed)
  Subjective:  Patient ID: Bob Ryan, male    DOB: [birth year],  MRN: [id number] HPI Chief Complaint  Patient presents with   Foot Injury    Plantar heel right [place], [place], seen PCP recommended visit here, tender, noticing small callused area starting sub 1st MPJ left    New Patient (Initial Visit)    46 y.o. male presents with the above complaint.   ROS: Denies fever chills nausea vomit muscle aches pains calf pain back pain chest pain shortness of breath.  Past Medical History:  Diagnosis Date   Hypertension    No past surgical history on file.  Current Outpatient Medications:    DULoxetine (CYMBALTA) 30 MG capsule, Take 1 capsule (30 mg total) by mouth daily., Disp: 30 capsule, Rfl: 1   rosuvastatin (CRESTOR) 20 MG tablet, Take 1 tablet (20 mg total) by mouth daily., Disp: 90 tablet, Rfl: 3   valsartan-hydrochlorothiazide (DIOVAN-HCT) 80-12.5 MG tablet, Take 1 tablet by mouth daily., Disp: 90 tablet, Rfl: 1  No Known Allergies Review of Systems Objective:  There were no vitals filed for this visit.  General: Well developed, nourished, in no acute distress, alert and oriented x3   Dermatological: Skin is warm, dry and supple bilateral. Nails x 10 are well maintained; remaining integument appears unremarkable at this time. There are no open sores, no preulcerative lesions, no rash or signs of infection present.  Prepped and area of his right foot just anterior to the heel with Betadine.  Using a chisel blade I was able to debride the skin and remove what appeared to be fragments of foreign body consistent with wood so a possible splinter or something like that.  He did have blood surrounding the area as well which was flushed out there is no need for stitches and there was no active bleeding.  Vascular: Dorsalis Pedis artery and Posterior Tibial artery pedal pulses are 2/4 bilateral with immedate capillary fill time.  Pedal hair growth present. No varicosities and no lower extremity edema present bilateral.   Neruologic: Grossly intact via light touch bilateral. Vibratory intact via tuning fork bilateral. Protective threshold with Semmes Wienstein monofilament intact to all pedal sites bilateral. Patellar and Achilles deep tendon reflexes 2+ bilateral. No Babinski or clonus noted bilateral.   Musculoskeletal: No gross boney pedal deformities bilateral. No pain, crepitus, or limitation noted with foot and ankle range of motion bilateral. Muscular strength 5/5 in all groups tested bilateral.  Gait: Unassisted, Nonantalgic.    Radiographs:  Reviewed radiographs previously taken by primary care provider  Assessment & Plan:   Assessment: Foreign body plantar aspect right heel  Plan: Removal foreign body placed Neosporin ointment and a Band-Aid.  Recommended that he continue to place Neosporin on the area daily and cover until completely well.  If this regresses or becomes red and painful he is to notify us immediately.     Rochella Benner T. [place], [place]

## 2022-04-12 ENCOUNTER — Ambulatory Visit: Payer: BC Managed Care – PPO | Admitting: Family Medicine

## 2022-04-15 MED ORDER — NA SULFATE-K SULFATE-MG SULF 17.5-3.13-1.6 GM/177ML PO SOLN: 1.0000 | Freq: Once | ORAL | 0 refills | Status: AC

## 2022-04-15 NOTE — Addendum Note (Signed)
Addended by: Roena Malady on: 04/15/2022 03:36 PM   Modules accepted: Orders

## 2022-04-17 ENCOUNTER — Inpatient Hospital Stay (INDEPENDENT_AMBULATORY_CARE_PROVIDER_SITE_OTHER): Payer: BC Managed Care – PPO | Admitting: Radiology

## 2022-04-17 ENCOUNTER — Encounter: Payer: Self-pay | Admitting: Family Medicine

## 2022-04-17 ENCOUNTER — Ambulatory Visit (INDEPENDENT_AMBULATORY_CARE_PROVIDER_SITE_OTHER): Payer: BC Managed Care – PPO | Admitting: Family Medicine

## 2022-04-17 VITALS — BP 124/98 | HR 66 | Ht 68.0 in | Wt 201.0 lb

## 2022-04-17 DIAGNOSIS — M19132 Post-traumatic osteoarthritis, left wrist: Secondary | ICD-10-CM | POA: Diagnosis not present

## 2022-04-17 DIAGNOSIS — I10 Essential (primary) hypertension: Secondary | ICD-10-CM

## 2022-04-17 MED ORDER — TRIAMCINOLONE ACETONIDE 40 MG/ML IJ SUSP
40.0000 mg | Freq: Once | INTRAMUSCULAR | Status: AC
  Administered 2022-04-17: 40 mg via INTRAMUSCULAR

## 2022-04-17 NOTE — Assessment & Plan Note (Signed)
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  He has had modest response with brace though is unable to use brace all the time due to work-related demands.  Given comorbid hypertension and desire to limit NSAIDs we discussed additional treatment strategies and he did elect to proceed with ultrasound guided distal radius corticosteroid intra-articular injection.  Post care reviewed and home exercises provided, can use brace as needed.

## 2022-04-17 NOTE — Addendum Note (Signed)
Addended by: Jerl Santos on: 04/17/2022 03:34 PM   Modules accepted: Orders

## 2022-04-17 NOTE — Patient Instructions (Addendum)
BLOOD PRESSURE - Restart cholesterol medication (rosuvastatin 20 mg) daily - After 1 week restart blood pressure medication (valsartan-hydrochlorothiazide 80-12.5 mg) daily - Continue healthy eating / lifestyle changes - Return in 2 months  LEFT WRIST You have just been given a cortisone injection to reduce pain and inflammation. After the injection you may notice immediate relief of pain as a result of the Lidocaine. It is important to rest the area of the injection for 24 to 48 hours after the injection. There is a possibility of some temporary increased discomfort and swelling for up to 72 hours until the cortisone begins to work. If you do have pain, simply rest the joint and use ice. If you can tolerate over the counter medications, you can try Tylenol, Aleve, or Advil for added relief per package instructions. - Rest x 2 days - Use brace when needed for pain - Start home exercises - Stop duloxetine - Follow-up as-needed

## 2022-04-17 NOTE — Assessment & Plan Note (Signed)
Chronic condition demonstrating improvement though still without adequate control.  Patient self discontinued valsartan-hydrochlorothiazide and rosuvastatin, has been aggressively modifying diet with reduced sodium intake.  Denies any cardiopulmonary complaints.  We will reintroduce rosuvastatin and after 1 week restart valsartan-hydrochlorothiazide, maintain follow-up in 2 months with continued focus on dietary/lifestyle modifications.

## 2022-04-17 NOTE — Progress Notes (Signed)
     Primary Care / Sports Medicine Office Visit  Patient Information:  Patient ID: Bob Ryan, male DOB: 06-03-1975 Age: 46 y.o. MRN: [id number]   Bob Ryan is a pleasant 46 y.o. male presenting with the following:  Chief Complaint  Patient presents with   Hypertension    Vitals:   04/17/22 0818 04/17/22 0820  BP: (!) 128/104 (!) 124/98  Pulse: 66   SpO2: 99%    Vitals:   04/17/22 0818  Weight: 201 lb (91.2 kg)  Height: 5\' 8"  (1.727 m)   Body mass index is 30.56 kg/m.  No results found.   Independent interpretation of notes and tests performed by another provider:   Independent interpretation of left wrist recent x-rays demonstrates prior nonunion of scaphoid fracture, significant joint space narrowing at the articulation between the radius and scaphoid at the radiocarpal joint.  Procedures performed:   Procedure:  Injection of left wrist radiocarpal joint under ultrasound guidance. Ultrasound guidance utilized for out of plane approach to left wrist radiocarpal joint between the scaphoid and radius that were dynamically visualized, no effusion Samsung HS60 device utilized with permanent recording / reporting. Verbal informed consent obtained and verified. Skin prepped in a sterile fashion. Ethyl chloride for topical local analgesia.  Completed without difficulty and tolerated well. Medication: triamcinolone acetonide 40 mg/mL suspension for injection 1 mL total and 2 mL lidocaine 1% without epinephrine utilized for needle placement anesthetic Advised to contact for fevers/chills, erythema, induration, drainage, or persistent bleeding.   Pertinent History, Exam, Impression, and Recommendations:   Problem List Items Addressed This Visit       Cardiovascular and Mediastinum   Hypertension - Primary    Chronic condition demonstrating improvement though still without adequate control.  Patient self discontinued valsartan-hydrochlorothiazide and  rosuvastatin, has been aggressively modifying diet with reduced sodium intake.  Denies any cardiopulmonary complaints.  We will reintroduce rosuvastatin and after 1 week restart valsartan-hydrochlorothiazide, maintain follow-up in 2 months with continued focus on dietary/lifestyle modifications.        Musculoskeletal and Integument   Post-traumatic osteoarthritis of left wrist (Chronic)    X-rays do show evidence of prior osseous trauma with nonunion, focal posttraumatic osteoarthritis at the radial scaphoid junction.  He has had modest response with brace though is unable to use brace all the time due to work-related demands.  Given comorbid hypertension and desire to limit NSAIDs we discussed additional treatment strategies and he did elect to proceed with ultrasound guided distal radius corticosteroid intra-articular injection.  Post care reviewed and home exercises provided, can use brace as needed.      Relevant Orders   LIMITED JOINT SPACE STRUCTURES UP LEFT     Orders & Medications No orders of the defined types were placed in this encounter.  Orders Placed This Encounter  Procedures   [place] LIMITED JOINT SPACE STRUCTURES UP LEFT     No follow-ups on file.     [place], MD, [facility]   Primary Care Sports Medicine Primary Care and Sports Medicine at [facility]

## 2022-04-30 ENCOUNTER — Encounter: Payer: Self-pay | Admitting: Gastroenterology

## 2022-05-02 ENCOUNTER — Encounter: Payer: Self-pay | Admitting: Gastroenterology

## 2022-05-09 ENCOUNTER — Encounter: Payer: Self-pay | Admitting: Cardiology

## 2022-05-09 ENCOUNTER — Ambulatory Visit: Payer: BC Managed Care – PPO | Attending: Cardiology | Admitting: Cardiology

## 2022-05-09 VITALS — BP 122/92 | HR 63 | Ht 67.0 in | Wt 202.2 lb

## 2022-05-09 DIAGNOSIS — E782 Mixed hyperlipidemia: Secondary | ICD-10-CM | POA: Diagnosis not present

## 2022-05-09 DIAGNOSIS — I10 Essential (primary) hypertension: Secondary | ICD-10-CM

## 2022-05-09 NOTE — Progress Notes (Signed)
Cardiology Office Note:    Date:  05/09/2022   ID:  Nickie Retort, DOB [birth year], MRN [id number]  PCP:  Montel Culver, MD   [place] Prov[facility] Cardiologist:  Kate Sable, MD     Referring MD: Montel Culver, MD   Chief Complaint  Patient presents with   New Patient (Initial Visit)    HTN, No history,    JESIEL GARATE is a 47 y.o. male who is being seen today for the evaluation of hypertension at the request of Zigmund Daniel Earley Abide, MD.   History of Present Illness:    JOHNOTHAN BASCOMB is a 47 y.o. male with a hx of hypertension, hyperlipidemia who presented due to elevated blood pressures.  Recently diagnosed with  hypertension.  Started on BP meds, felt dizzy.  Switch to taking meds daily PAF which has improved his symptoms.  Also started on Crestor 20 mg daily.  Has consistently been on medications about 5 days now.  He drinks about 3 beers during the week, 18 on Friday/weekends.  Denies smoking.  Denies chest pain or shortness of breath.  Past Medical History:  Diagnosis Date   Hypertension     History reviewed. No pertinent surgical history.  Current Medications: Current Meds  Medication Sig   rosuvastatin (CRESTOR) 20 MG tablet Take 1 tablet (20 mg total) by mouth daily.   valsartan-hydrochlorothiazide (DIOVAN-HCT) 80-12.5 MG tablet Take 1 tablet by mouth daily.     Allergies:   Patient has no known allergies.   Social History   Socioeconomic History   Marital status: Married    Spouse name: Not on file   Number of children: Not on file   Years of education: Not on file   Highest education level: Not on file  Occupational History   Not on file  Tobacco Use   Smoking status: Never    Passive exposure: Past   Smokeless tobacco: Current    Types: Snuff  Vaping Use   Vaping Use: Never used  Substance and Sexual Activity   Alcohol use: Yes    Alcohol/week: 28.0 standard drinks of alcohol    Types: 28 Cans of beer per week     Comment: average per week   Drug use: Never   Sexual activity: Never  Other Topics Concern   Not on file  Social History Narrative   Not on file   Social Determinants of Health   Financial Resource Strain: Not on file  Food Insecurity: Not on file  Transportation Needs: Not on file  Physical Activity: Not on file  Stress: Not on file  Social Connections: Not on file     Family History: The patient's family history includes Healthy in his father and mother.  ROS:   Please see the history of present illness.     All other systems reviewed and are negative.  EKGs/Labs/Other Studies Reviewed:    The following studies were reviewed today:   EKG:  EKG is  ordered today.  The ekg ordered today demonstrates normal sinus rhythm  Recent Labs: 10/10/2021: ALT 67; BUN 12; Creatinine, Ser 1.13; Hemoglobin 18.4; Platelets 281; Potassium 4.7; Sodium 137; TSH 2.100  Recent Lipid Panel    Component Value Date/Time   CHOL 232 (H) 10/10/2021 0812   TRIG 203 (H) 10/10/2021 0812   HDL 33 (L) 10/10/2021 0812   CHOLHDL 7.0 (H) 10/10/2021 0812   LDLCALC 161 (H) 10/10/2021 7672     Risk Assessment/Calculations:  HYPERTENSION CONTROL Vitals:   05/09/22 0913 05/09/22 0920  BP: (!) 128/98 (!) 122/92    The patient's blood pressure is elevated above target today.  In order to address the patient's elevated BP: Blood pressure will be monitored at home to determine if medication changes need to be made.           Physical Exam:    VS:  BP (!) 122/92 (BP Location: Right Arm, Patient Position: Sitting)   Pulse 63   Ht 5\' 7"  (1.702 m)   Wt 202 lb 3.2 oz (91.7 kg)   SpO2 95%   BMI 31.67 kg/m     Wt Readings from Last 3 Encounters:  05/09/22 202 lb 3.2 oz (91.7 kg)  04/17/22 201 lb (91.2 kg)  03/14/22 206 lb (93.4 kg)     GEN:  Well nourished, well developed in no acute distress HEENT: Normal NECK: No JVD; No carotid bruits CARDIAC: RRR, no murmurs, rubs,  gallops RESPIRATORY:  Clear to auscultation without rales, wheezing or rhonchi  ABDOMEN: Soft, non-tender, non-distended MUSCULOSKELETAL:  No edema; No deformity  SKIN: Warm and dry NEUROLOGIC:  Alert and oriented x 3 PSYCHIATRIC:  Normal affect   ASSESSMENT:    1. Primary hypertension   2. Mixed hyperlipidemia    PLAN:    In order of problems listed above:  Hypertension, BP reasonable, EtOH use contributing.  Advised to cut back on alcohol, continue valsartan-HCTZ as prescribed.  Low-salt diet also advised.  Consider titration if BP stays elevated. Hyperlipidemia, advised to take Crestor 20 mg daily as prescribed.  Repeat lipid panel in 3 months.  Follow-up in 3 months after repeat lipid panel.  Patient otherwise asymptomatic.     Medication Adjustments/Labs and Tests Ordered: Current medicines are reviewed at length with the patient today.  Concerns regarding medicines are outlined above.  Orders Placed This Encounter  Procedures   Lipid panel   EKG 12-Lead   No orders of the defined types were placed in this encounter.   Patient Instructions  Medication Instructions:   Your physician recommends that you continue on your current medications as directed. Please refer to the Current Medication list given to you today.  *If you need a refill on your cardiac medications before your next appointment, please call your pharmacy*   Lab Work:  Your physician recommends that you return for lab work in: [facility]. You will need to be fasting.  No appt is needed. Hours are M-F 7AM- 6 PM.  If you have labs (blood work) drawn today and your tests are completely normal, you will receive your results only by: [place] (if you have MyChart) OR A paper copy in the mail If you have any lab test that is abnormal or we need to change your treatment, we will call you to review the results.   Testing/Procedures:  None Ordered   Follow-Up: At [facility], you and your health needs are our priority.  As part of our continuing mission to provide you with exceptional heart care, we have created designated Provider Care Teams.  These Care Teams include your primary Cardiologist (physician) and Advanced Practice Providers (APPs -  Physician Assistants and Nurse Practitioners) who all work together to provide you with the care you need, when you need it.  We recommend signing up for the patient portal called "MyChart".  Sign up information is provided on this After Visit Summary.  MyChart is used to connect  with patients for Virtual Visits (Telemedicine).  Patients are able to view lab/test results, encounter notes, upcoming appointments, etc.  Non-urgent messages can be sent to your provider as well.   To learn more about what you can do with MyChart, go to [email or web address].    Your next appointment:   3 month(s)  Provider:   You may see Debbe Odea, MD or one of the following Advanced Practice Providers on your designated Care Team:   Nicolasa Ducking, NP Eula Listen, PA-C Cadence Fransico Michael, PA-C Charlsie Quest, NP    Signed, Debbe Odea, MD  05/09/2022 10:07 AM    [facility]

## 2022-05-09 NOTE — Patient Instructions (Signed)
Medication Instructions:   Your physician recommends that you continue on your current medications as directed. Please refer to the Current Medication list given to you today.  *If you need a refill on your cardiac medications before your next appointment, please call your pharmacy*   Lab Work:  Your physician recommends that you return for lab work in: [facility]. You will need to be fasting.  No appt is needed. Hours are M-F 7AM- 6 PM.  If you have labs (blood work) drawn today and your tests are completely normal, you will receive your results only by: MyChart Message (if you have MyChart) OR A paper copy in the mail If you have any lab test that is abnormal or we need to change your treatment, we will call you to review the results.   Testing/Procedures:  None Ordered   Follow-Up: At [facility], you and your health needs are our priority.  As part of our continuing mission to provide you with exceptional heart care, we have created designated Provider Care Teams.  These Care Teams include your primary Cardiologist (physician) and Advanced Practice Providers (APPs -  Physician Assistants and Nurse Practitioners) who all work together to provide you with the care you need, when you need it.  We recommend signing up for the patient portal called "MyChart".  Sign up information is provided on this After Visit Summary.  MyChart is used to connect with patients for Virtual Visits (Telemedicine).  Patients are able to view lab/test results, encounter notes, upcoming appointments, etc.  Non-urgent messages can be sent to your provider as well.   To learn more about what you can do with MyChart, go to [email or web address].    Your next appointment:   3 month(s)  Provider:   You may see Brian Agbor-Etang, MD or one of the following Advanced Practice Providers on your designated Care Team:   Christopher Berge, NP Ryan Dunn, PA-C Cadence Furth, PA-C Sheri  Hammock, NP  

## 2022-05-10 NOTE — Anesthesia Preprocedure Evaluation (Addendum)
Anesthesia Evaluation  Patient identified by MRN, date of birth, ID band Patient awake    Reviewed: Allergy & Precautions, NPO status , Patient's Chart, lab work & pertinent test results  Airway Mallampati: II  TM Distance: >3 FB Neck ROM: full    Dental no notable dental hx.    Pulmonary neg pulmonary ROS   Pulmonary exam normal        Cardiovascular hypertension, Normal cardiovascular exam     Neuro/Psych negative neurological ROS  negative psych ROS   GI/Hepatic negative GI ROS, Neg liver ROS,,,  Endo/Other  negative endocrine ROS    Renal/GU negative Renal ROS  negative genitourinary   Musculoskeletal   Abdominal Normal abdominal exam  (+)   Peds  Hematology negative hematology ROS (+)   Anesthesia Other Findings Past Medical History: No date: Hypertension      Reproductive/Obstetrics negative OB ROS                             Anesthesia Physical Anesthesia Plan  ASA: 2  Anesthesia Plan: General   Post-op Pain Management:    Induction: Intravenous  PONV Risk Score and Plan: Propofol infusion and TIVA  Airway Management Planned: Natural Airway  Additional Equipment:   Intra-op Plan:   Post-operative Plan:   Informed Consent: I have reviewed the patients History and Physical, chart, labs and discussed the procedure including the risks, benefits and alternatives for the proposed anesthesia with the patient or authorized representative who has indicated his/her understanding and acceptance.     Dental Advisory Given  Plan Discussed with: Anesthesiologist, CRNA and Surgeon  Anesthesia Plan Comments:         Anesthesia Quick Evaluation

## 2022-05-13 ENCOUNTER — Ambulatory Visit: Payer: BC Managed Care – PPO | Admitting: Anesthesiology

## 2022-05-13 ENCOUNTER — Encounter: Payer: Self-pay | Admitting: Gastroenterology

## 2022-05-13 ENCOUNTER — Encounter
Admission: RE | Disposition: A | Discharge status: Home / Self Care | Payer: Self-pay | Source: Home / Self Care | Attending: Gastroenterology

## 2022-05-13 ENCOUNTER — Other Ambulatory Visit: Payer: Self-pay

## 2022-05-13 ENCOUNTER — Ambulatory Visit
Admission: RE | Admit: 2022-05-13 | Discharge: 2022-05-13 | Disposition: A | Discharge status: Home / Self Care | Payer: BC Managed Care – PPO | Attending: Gastroenterology | Admitting: Gastroenterology

## 2022-05-13 DIAGNOSIS — Z1211 Encounter for screening for malignant neoplasm of colon: Secondary | ICD-10-CM | POA: Diagnosis not present

## 2022-05-13 DIAGNOSIS — I10 Essential (primary) hypertension: Secondary | ICD-10-CM | POA: Diagnosis not present

## 2022-05-13 DIAGNOSIS — K640 First degree hemorrhoids: Secondary | ICD-10-CM | POA: Diagnosis not present

## 2022-05-13 HISTORY — PX: COLONOSCOPY WITH PROPOFOL: SHX5780

## 2022-05-13 SURGERY — COLONOSCOPY WITH PROPOFOL
Anesthesia: General

## 2022-05-13 MED ORDER — STERILE WATER FOR IRRIGATION IR SOLN
Status: DC | PRN
  Administered 2022-05-13: 60 mL

## 2022-05-13 MED ORDER — PROPOFOL 10 MG/ML IV BOLUS
INTRAVENOUS | Status: DC | PRN
  Administered 2022-05-13 (×2): 40 mg via INTRAVENOUS
  Administered 2022-05-13: 110 mg via INTRAVENOUS
  Administered 2022-05-13 (×2): 40 mg via INTRAVENOUS

## 2022-05-13 MED ORDER — STERILE WATER FOR IRRIGATION IR SOLN
Status: DC | PRN
  Administered 2022-05-13: 1

## 2022-05-13 MED ORDER — SODIUM CHLORIDE 0.9 % IV SOLN: INTRAVENOUS | Status: DC

## 2022-05-13 MED ORDER — LIDOCAINE HCL (CARDIAC) PF 100 MG/5ML IV SOSY
PREFILLED_SYRINGE | INTRAVENOUS | Status: DC | PRN
  Administered 2022-05-13: 80 mg via INTRAVENOUS

## 2022-05-13 MED ORDER — LACTATED RINGERS IV SOLN: INTRAVENOUS | Status: DC

## 2022-05-13 SURGICAL SUPPLY — 5 items
GOWN CVR UNV OPN BCK APRN NK (MISCELLANEOUS) ×2 IMPLANT
GOWN ISOL THUMB LOOP REG UNIV (MISCELLANEOUS) ×2
KIT PRC NS LF DISP ENDO (KITS) ×1 IMPLANT
KIT PROCEDURE OLYMPUS (KITS) ×1
MANIFOLD NEPTUNE II (INSTRUMENTS) ×1 IMPLANT

## 2022-05-13 NOTE — Op Note (Signed)
[facility]astroenterology Patient Name: Bob Ryan Procedure Date: 05/13/2022 7:08 AM MRN: [id number] Account #: [id number] Date of Birth: 03/27/1976 Admit Type: Outpatient Age: 47 Room: [facility] OR ROOM 01 Gender: Male Note Status: Finalized Instrument Name: [id number] Procedure:             Colonoscopy Indications:           Screening for colorectal malignant neoplasm Providers:             Midge Minium MD, MD Referring MD:          MD Joseph Berkshire Medicines:             Propofol per Anesthesia Complications:         No immediate complications. Procedure:             Pre-Anesthesia Assessment:                        - Prior to the procedure, a History and Physical was                         performed, and patient medications and allergies were                         reviewed. The patient's tolerance of previous                         anesthesia was also reviewed. The risks and benefits                         of the procedure and the sedation options and risks                         were discussed with the patient. All questions were                         answered, and informed consent was obtained. Prior                         Anticoagulants: The patient has taken no anticoagulant                         or antiplatelet agents. ASA Grade Assessment: II - A                         patient with mild systemic disease. After reviewing                         the risks and benefits, the patient was deemed in                         satisfactory condition to undergo the procedure.                        After obtaining informed consent, the colonoscope was                         passed under direct vision. Throughout the procedure,  the patient's blood pressure, pulse, and oxygen                         saturations were monitored continuously. The was                         introduced through the anus and advanced to the the                          cecum, identified by appendiceal orifice and ileocecal                         valve. The colonoscopy was performed without                         difficulty. The patient tolerated the procedure well.                         The quality of the bowel preparation was excellent. Findings:      The perianal and digital rectal examinations were normal.      Non-bleeding internal hemorrhoids were found during retroflexion. The       hemorrhoids were Grade I (internal hemorrhoids that do not prolapse). Impression:            - Non-bleeding internal hemorrhoids.                        - No specimens collected. Recommendation:        - Discharge patient to home.                        - Resume previous diet.                        - Continue present medications.                        - Repeat colonoscopy in 10 years for screening                         purposes. Procedure Code(s):     --- Professional ---                        [phone], Colonoscopy, flexible; diagnostic, including                         collection of specimen(s) by brushing or washing, when                         performed (separate procedure) Diagnosis Code(s):     --- Professional ---                        Z12.11, Encounter for screening for malignant neoplasm                         of colon CPT copyright 2022 American Medical Association. All rights reserved. The codes documented in this report are preliminary and upon coder review may  be revised to meet current compliance requirements. Lucilla Lame MD, MD 05/13/2022  8:14:13 AM This report has been signed electronically. Number of Addenda: 0 Note Initiated On: 05/13/2022 7:08 AM Scope Withdrawal Time: 0 hours 7 minutes 45 seconds  Total Procedure Duration: 0 hours 10 minutes 36 seconds  Estimated Blood Loss:  Estimated blood loss: none.      [facility]

## 2022-05-13 NOTE — Anesthesia Postprocedure Evaluation (Signed)
Anesthesia Post Note  Patient: Bob Ryan  Procedure(s) Performed: COLONOSCOPY WITH PROPOFOL  Patient location during evaluation: PACU Anesthesia Type: General Level of consciousness: awake and alert Pain management: pain level controlled Vital Signs Assessment: post-procedure vital signs reviewed and stable Respiratory status: spontaneous breathing, nonlabored ventilation and respiratory function stable Cardiovascular status: blood pressure returned to baseline and stable Postop Assessment: no apparent nausea or vomiting Anesthetic complications: no   No notable events documented.   Last Vitals:  Vitals:   05/13/22 0825 05/13/22 0830  BP: 110/74 100/77  Pulse: 75 72  Resp: 14 (!) 21  Temp:    SpO2: 94% 94%    Last Pain:  Vitals:   05/13/22 0830  TempSrc:   PainSc: 0-No pain                 [place] Ouch

## 2022-05-13 NOTE — H&P (Signed)
   Lucilla Lame, MD [address] [place], [place] [address] Phone: [phone] Fax : [phone]  Primary Care Physician:  Montel Culver, MD Primary Gastroenterologist:  Dr. Allen Norris  Pre-Procedure History & Physical: HPI:  Bob Ryan is a 47 y.o. male is here for a screening colonoscopy.   Past Medical History:  Diagnosis Date   Hypertension     History reviewed. No pertinent surgical history.  Prior to Admission medications   Medication Sig Start Date End Date Taking? Authorizing Provider  rosuvastatin (CRESTOR) 20 MG tablet Take 1 tablet (20 mg total) by mouth daily. 03/14/22  Yes Montel Culver, MD  valsartan-hydrochlorothiazide (DIOVAN-HCT) 80-12.5 MG tablet Take 1 tablet by mouth daily. 03/14/22  Yes Montel Culver, MD    Allergies as of 03/14/2022   (No Known Allergies)    Family History  Problem Relation Age of Onset   Healthy Mother    Healthy Father     Social History   Socioeconomic History   Marital status: Married    Spouse name: Not on file   Number of children: Not on file   Years of education: Not on file   Highest education level: Not on file  Occupational History   Not on file  Tobacco Use   Smoking status: Never    Passive exposure: Past   Smokeless tobacco: Current    Types: Snuff  Vaping Use   Vaping Use: Never used  Substance and Sexual Activity   Alcohol use: Yes    Alcohol/week: 28.0 standard drinks of alcohol    Types: 28 Cans of beer per week    Comment: average per week   Drug use: Never   Sexual activity: Never  Other Topics Concern   Not on file  Social History Narrative   Not on file   Social Determinants of Health   Financial Resource Strain: Not on file  Food Insecurity: Not on file  Transportation Needs: Not on file  Physical Activity: Not on file  Stress: Not on file  Social Connections: Not on file  Intimate Partner Violence: Not on file    Review of Systems: See HPI, otherwise  negative ROS  Physical Exam: BP (!) 140/94   Temp 99 F (37.2 C) (Tympanic)   Resp (!) 22   Ht 5' 7.01" (1.702 m)   Wt 89 kg   SpO2 96%   BMI 30.74 kg/m  General:   Alert,  pleasant and cooperative in NAD Head:  Normocephalic and atraumatic. Neck:  Supple; no masses or thyromegaly. Lungs:  Clear throughout to auscultation.    Heart:  Regular rate and rhythm. Abdomen:  Soft, nontender and nondistended. Normal bowel sounds, without guarding, and without rebound.   Neurologic:  Alert and  oriented x4;  grossly normal neurologically.  Impression/Plan: Bob Ryan is now here to undergo a screening colonoscopy.  Risks, benefits, and alternatives regarding colonoscopy have been reviewed with the patient.  Questions have been answered.  All parties agreeable.

## 2022-05-13 NOTE — Transfer of Care (Signed)
Immediate Anesthesia Transfer of Care Note  Patient: Bob Ryan  Procedure(s) Performed: COLONOSCOPY WITH PROPOFOL  Patient Location: PACU  Anesthesia Type: General  Level of Consciousness: awake, alert  and patient cooperative  Airway and Oxygen Therapy: Patient Spontanous Breathing and Patient connected to supplemental oxygen  Post-op Assessment: Post-op Vital signs reviewed, Patient's Cardiovascular Status Stable, Respiratory Function Stable, Patent Airway and No signs of Nausea or vomiting  Post-op Vital Signs: Reviewed and stable  Complications: No notable events documented.

## 2022-05-14 ENCOUNTER — Encounter: Payer: Self-pay | Admitting: Gastroenterology

## 2022-06-18 ENCOUNTER — Encounter: Payer: Self-pay | Admitting: Family Medicine

## 2022-06-18 ENCOUNTER — Ambulatory Visit (INDEPENDENT_AMBULATORY_CARE_PROVIDER_SITE_OTHER): Payer: BC Managed Care – PPO | Admitting: Family Medicine

## 2022-06-18 VITALS — BP 136/82 | HR 77 | Wt 210.0 lb

## 2022-06-18 DIAGNOSIS — I10 Essential (primary) hypertension: Secondary | ICD-10-CM

## 2022-06-18 DIAGNOSIS — M19132 Post-traumatic osteoarthritis, left wrist: Secondary | ICD-10-CM

## 2022-06-18 DIAGNOSIS — E782 Mixed hyperlipidemia: Secondary | ICD-10-CM | POA: Diagnosis not present

## 2022-06-18 DIAGNOSIS — Z23 Encounter for immunization: Secondary | ICD-10-CM | POA: Diagnosis not present

## 2022-06-18 DIAGNOSIS — Z1211 Encounter for screening for malignant neoplasm of colon: Secondary | ICD-10-CM

## 2022-06-18 NOTE — Progress Notes (Signed)
     Primary Care / Sports Medicine Office Visit  Patient Information:  Patient ID: TAHAJ STAKER, male DOB: [birth year] Age: 47 y.o. MRN: [id number]   WYNTON ELSTAD is a pleasant 47 y.o. male presenting with the following:  Chief Complaint  Patient presents with   Hypertension    Vitals:   06/18/22 0755 06/18/22 0756  BP: 132/80 136/82  Pulse: 77   SpO2: 97%    Vitals:   06/18/22 0755  Weight: 210 lb (95.3 kg)   Body mass index is 32.88 kg/m.  No results found.   Independent interpretation of notes and tests performed by another provider:   None  Procedures performed:   None  Pertinent History, Exam, Impression, and Recommendations:   Andwele was seen today for hypertension.  Hypertension, unspecified type Assessment & Plan: Chronic, stable condition, has been tolerating medication well without issue, having nightly dreams which is new for patient, no negative content in dreams.  Denies any cardiopulmonary complaints and examination with positive S1 and S2, regular rate and rhythm, no additional heart sounds, clear lung fields throughout without wheezes, rales, rhonchi.  I have encouraged continued lifestyle modifications which she has been pursuing, medication compliance addressed, he has been good with this, has a follow-up with cardiology and will defer further medication management to them.  He will return for routine follow-up in 6 months.  Outside cardiology notes reviewed.   Need for Tdap vaccination -     Tdap vaccine greater than or equal to 7yo IM  Mixed hyperlipidemia Assessment & Plan: Has been compliant with lifestyle modifications and statin.   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 He has noted progression of his pain over the past year without any additional trauma, swelling to the dorsal radial aspect of his wrist. Denies any paresthesias, pain with ADLs.   Assessment & Plan: Chronic condition demonstrating interim improvement following prior corticosteroid injection, still with limited range of motion though pain has been well-controlled.  Discussed timeline for possible repeat injections, utilizing cortisone sparingly, and follow-up as needed for this issue.   Encounter for screening colonoscopy Assessment & Plan: Completed screening colonoscopy, was advised 10-year follow-up.      Orders & Medications No orders of the defined types were placed in this encounter.  Orders Placed This Encounter  Procedures   Tdap vaccine greater than or equal to 7yo IM     Return in about 6 months (around 12/17/2022) for f/u.     Montel Culver, MD, [facility]   Primary Care Sports Medicine Primary Care and Sports Medicine at [facility]

## 2022-06-18 NOTE — Assessment & Plan Note (Signed)
Chronic, stable condition, has been tolerating medication well without issue, having nightly dreams which is new for patient, no negative content in dreams.  Denies any cardiopulmonary complaints and examination with positive S1 and S2, regular rate and rhythm, no additional heart sounds, clear lung fields throughout without wheezes, rales, rhonchi.  I have encouraged continued lifestyle modifications which she has been pursuing, medication compliance addressed, he has been good with this, has a follow-up with cardiology and will defer further medication management to them.  He will return for routine follow-up in 6 months.  Outside cardiology notes reviewed.

## 2022-06-18 NOTE — Assessment & Plan Note (Signed)
Has been compliant with lifestyle modifications and statin.

## 2022-06-18 NOTE — Assessment & Plan Note (Signed)
Completed screening colonoscopy, was advised 10-year follow-up.

## 2022-06-18 NOTE — Assessment & Plan Note (Signed)
Chronic condition demonstrating interim improvement following prior corticosteroid injection, still with limited range of motion though pain has been well-controlled.  Discussed timeline for possible repeat injections, utilizing cortisone sparingly, and follow-up as needed for this issue.

## 2022-08-05 DIAGNOSIS — M5412 Radiculopathy, cervical region: Secondary | ICD-10-CM | POA: Diagnosis not present

## 2022-08-05 DIAGNOSIS — M25511 Pain in right shoulder: Secondary | ICD-10-CM | POA: Diagnosis not present

## 2022-08-05 DIAGNOSIS — M6281 Muscle weakness (generalized): Secondary | ICD-10-CM | POA: Diagnosis not present

## 2022-08-06 ENCOUNTER — Ambulatory Visit (INDEPENDENT_AMBULATORY_CARE_PROVIDER_SITE_OTHER): Payer: BC Managed Care – PPO | Admitting: Physician Assistant

## 2022-08-06 ENCOUNTER — Encounter: Payer: Self-pay | Admitting: Physician Assistant

## 2022-08-06 VITALS — BP 152/108 | HR 88 | Temp 98.3°F | Ht 67.0 in | Wt 206.0 lb

## 2022-08-06 DIAGNOSIS — J020 Streptococcal pharyngitis: Secondary | ICD-10-CM

## 2022-08-06 LAB — POCT RAPID STREP A (OFFICE): Rapid Strep A Screen: POSITIVE — AB

## 2022-08-06 MED ORDER — PENICILLIN V POTASSIUM 500 MG PO TABS: 500.0000 mg | ORAL_TABLET | Freq: Two times a day (BID) | ORAL | 0 refills | Status: AC

## 2022-08-06 NOTE — Progress Notes (Signed)
Date:  08/06/2022   Name:  Bob Ryan   DOB:  April 07, 1976   MRN:  [id number]   Chief Complaint: Sore Throat  Sore Throat  This is a new problem. Episode onset: X 4 days. The problem has been gradually improving. Sore throat worse side: the center of throat. There has been no fever. The pain is at a severity of 3/10. The pain is mild. Associated symptoms include trouble swallowing. He has tried cool liquids (OTC cold and flu meds) for the symptoms. The treatment provided mild relief.   Bob Ryan is a pleasant 47 year old male new to me today typically seen by my colleague Dr. Joseph BerkshireJason Matthews, MD, here for evaluation of acute onset sore throat 4 days ago without other cold symptoms.  Pain peaked 3 days ago on Saturday and has been slightly improving since then.  No associated fever to the patient's knowledge.  Endorses pain with swallowing.  Current pain level is 3/10.  Did not take his blood pressure last night because he was worried it would interact with cold medicine, also drank diet Mountain Dew at 6 AM.  No known sick contacts  Recent Labs     Component Value Date/Time   NA 137 10/10/2021 0812   K 4.7 10/10/2021 0812   CL 97 10/10/2021 0812   CO2 26 10/10/2021 0812   GLUCOSE 93 10/10/2021 0812   BUN 12 10/10/2021 0812   CREATININE 1.13 10/10/2021 0812   CALCIUM 9.5 10/10/2021 0812   PROT 7.2 10/10/2021 0812   ALBUMIN 4.7 10/10/2021 0812   AST 49 (H) 10/10/2021 0812   ALT 67 (H) 10/10/2021 0812   ALKPHOS 78 10/10/2021 0812   BILITOT 0.8 10/10/2021 0812    Lab Results  Component Value Date   WBC 7.3 10/10/2021   HGB 18.4 (H) 10/10/2021   HCT 52.5 (H) 10/10/2021   MCV 88 10/10/2021   PLT 281 10/10/2021   No results found for: "HGBA1C" Lab Results  Component Value Date   CHOL 232 (H) 10/10/2021   HDL 33 (L) 10/10/2021   LDLCALC 161 (H) 10/10/2021   TRIG 203 (H) 10/10/2021   CHOLHDL 7.0 (H) 10/10/2021   Lab Results  Component Value Date   TSH 2.100 10/10/2021     Review of Systems  HENT:  Positive for trouble swallowing.     Patient Active Problem List   Diagnosis Date Noted   Need for Tdap vaccination 06/18/2022   Foreign body of skin of plantar aspect of right foot 03/14/2022   Post-traumatic osteoarthritis of left wrist 03/14/2022   Mixed hyperlipidemia 03/14/2022   Encounter for screening colonoscopy 03/14/2022   Rotator cuff dysfunction, right 09/28/2021   Hypertension 08/31/2021   Snoring 08/31/2021    No Known Allergies  Past Surgical History:  Procedure Laterality Date   COLONOSCOPY WITH PROPOFOL N/A 05/13/2022   Procedure: COLONOSCOPY WITH PROPOFOL;  Surgeon: Midge MiniumWohl, Darren, MD;  [facility];  Service: Endoscopy;  Laterality: N/A;    Social History   Tobacco Use   Smoking status: Never    Passive exposure: Past   Smokeless tobacco: Current    Types: Snuff  Vaping Use   Vaping Use: Never used  Substance Use Topics   Alcohol use: Yes    Alcohol/week: 28.0 standard drinks of alcohol    Types: 28 Cans of beer per week    Comment: average per week   Drug use: Never     Medication list has been reviewed and  updated.  Current Meds  Medication Sig   penicillin v potassium (VEETID) 500 MG tablet Take 1 tablet (500 mg total) by mouth in the morning and at bedtime for 10 days.   rosuvastatin (CRESTOR) 20 MG tablet Take 1 tablet (20 mg total) by mouth daily.   valsartan-hydrochlorothiazide (DIOVAN-HCT) 80-12.5 MG tablet Take 1 tablet by mouth daily.       08/06/2022    8:15 AM 06/18/2022    7:58 AM 09/28/2021    8:08 AM 08/31/2021    1:46 PM  GAD 7 : Generalized Anxiety Score  Nervous, Anxious, on Edge 0 0 0 0  Control/stop worrying 0 0 0 0  Worry too much - different things 0 0 0 0  Trouble relaxing 0 0 0 0  Restless 0 0 0 2  Easily annoyed or irritable 0 0 0 2  Afraid - awful might happen 0 0 0 0  Total GAD 7 Score 0 0 0 4  Anxiety Difficulty Not difficult at all Not difficult at all Not difficult  at all Not difficult at all       08/06/2022    8:15 AM 06/18/2022    7:58 AM 09/28/2021    8:08 AM  Depression screen PHQ 2/9  Decreased Interest 0 0 0  Down, Depressed, Hopeless 0 0 0  PHQ - 2 Score 0 0 0  Altered sleeping 0 0 0  Tired, decreased energy 0 0 0  Change in appetite 0 0 0  Feeling bad or failure about yourself  0 0 0  Trouble concentrating 0 0 0  Moving slowly or fidgety/restless 0 0 0  Suicidal thoughts 0 0 0  PHQ-9 Score 0 0 0  Difficult doing work/chores Not difficult at all Not difficult at all Not difficult at all    BP Readings from Last 3 Encounters:  08/06/22 (!) 152/108  06/18/22 136/82  05/13/22 100/77    Wt Readings from Last 3 Encounters:  08/06/22 206 lb (93.4 kg)  06/18/22 210 lb (95.3 kg)  05/13/22 196 lb 4.8 oz (89 kg)    BP (!) 152/108 (BP Location: Left Arm, Patient Position: Sitting, Cuff Size: Large)   Pulse 88   Temp 98.3 F (36.8 C) (Oral)   Ht 5\' 7"  (1.702 m)   Wt 206 lb (93.4 kg)   SpO2 95%   BMI 32.26 kg/m   Physical Exam Vitals and nursing note reviewed.  Constitutional:      General: He is not in acute distress.    Appearance: Normal appearance.     Comments: Patient feels very warm to the touch on exam, but is afebrile at present  HENT:     Right Ear: Tympanic membrane normal.     Left Ear: Tympanic membrane normal.     Nose: Nose normal.     Mouth/Throat:     Pharynx: Uvula midline. Oropharyngeal exudate, posterior oropharyngeal erythema and uvula swelling present.     Comments: Uvula is swollen, inferior half of the uvula is entirely white with exudate.  Tonsils not visualized today Eyes:     Conjunctiva/sclera: Conjunctivae normal.  Pulmonary:     Effort: Pulmonary effort is normal.  Lymphadenopathy:     Cervical: No cervical adenopathy.      Assessment and Plan:  1. Strep pharyngitis Rapid strep positive with high clinical correlation. Treat with PCN. Advised avoiding close contact with others until 24h  on antibiotics. Complete the full 10-day course. OTC analgesics advised including NSAIDs,  acetaminophen, and topical throat spray e.g. chloraseptic. - POCT rapid strep A - penicillin v potassium (VEETID) 500 MG tablet; Take 1 tablet (500 mg total) by mouth in the morning and at bedtime for 10 days.  Dispense: 20 tablet; Refill: 0   Return if symptoms worsen or fail to improve.   Partially dictated using Animal nutritionist. Any errors are unintentional.  Alvester Morin, PA-C, DMSc, Nutritionist [facility] Primary Care and Sports Medicine [facility] [phone]

## 2022-08-08 ENCOUNTER — Ambulatory Visit: Payer: BC Managed Care – PPO | Admitting: Cardiology

## 2022-08-09 DIAGNOSIS — Z01818 Encounter for other preprocedural examination: Secondary | ICD-10-CM | POA: Diagnosis not present

## 2022-08-09 DIAGNOSIS — M5412 Radiculopathy, cervical region: Secondary | ICD-10-CM | POA: Diagnosis not present

## 2022-08-09 DIAGNOSIS — M25511 Pain in right shoulder: Secondary | ICD-10-CM | POA: Diagnosis not present

## 2022-08-09 DIAGNOSIS — M6281 Muscle weakness (generalized): Secondary | ICD-10-CM | POA: Diagnosis not present

## 2022-08-15 DIAGNOSIS — M75121 Complete rotator cuff tear or rupture of right shoulder, not specified as traumatic: Secondary | ICD-10-CM | POA: Diagnosis not present

## 2022-08-15 DIAGNOSIS — M5412 Radiculopathy, cervical region: Secondary | ICD-10-CM | POA: Diagnosis not present

## 2022-08-21 DIAGNOSIS — M5412 Radiculopathy, cervical region: Secondary | ICD-10-CM | POA: Insufficient documentation

## 2022-08-22 DIAGNOSIS — M5020 Other cervical disc displacement, unspecified cervical region: Secondary | ICD-10-CM | POA: Diagnosis not present

## 2022-08-22 DIAGNOSIS — M50221 Other cervical disc displacement at C4-C5 level: Secondary | ICD-10-CM | POA: Diagnosis not present

## 2022-09-11 ENCOUNTER — Ambulatory Visit: Payer: BC Managed Care – PPO | Admitting: Cardiology

## 2022-10-01 DIAGNOSIS — R29898 Other symptoms and signs involving the musculoskeletal system: Secondary | ICD-10-CM | POA: Diagnosis not present

## 2022-10-01 DIAGNOSIS — S46011A Strain of muscle(s) and tendon(s) of the rotator cuff of right shoulder, initial encounter: Secondary | ICD-10-CM | POA: Diagnosis not present

## 2022-10-01 DIAGNOSIS — M25511 Pain in right shoulder: Secondary | ICD-10-CM | POA: Diagnosis not present

## 2022-10-07 DIAGNOSIS — M4722 Other spondylosis with radiculopathy, cervical region: Secondary | ICD-10-CM | POA: Diagnosis not present

## 2022-10-07 DIAGNOSIS — M50121 Cervical disc disorder at C4-C5 level with radiculopathy: Secondary | ICD-10-CM | POA: Diagnosis not present

## 2022-10-07 DIAGNOSIS — M50122 Cervical disc disorder at C5-C6 level with radiculopathy: Secondary | ICD-10-CM | POA: Diagnosis not present

## 2022-10-15 DIAGNOSIS — M50122 Cervical disc disorder at C5-C6 level with radiculopathy: Secondary | ICD-10-CM | POA: Diagnosis not present

## 2022-10-15 DIAGNOSIS — M4722 Other spondylosis with radiculopathy, cervical region: Secondary | ICD-10-CM | POA: Diagnosis not present

## 2022-10-15 DIAGNOSIS — M50121 Cervical disc disorder at C4-C5 level with radiculopathy: Secondary | ICD-10-CM | POA: Diagnosis not present

## 2022-10-15 DIAGNOSIS — M25511 Pain in right shoulder: Secondary | ICD-10-CM | POA: Diagnosis not present

## 2022-10-16 ENCOUNTER — Telehealth: Payer: Self-pay | Admitting: Family Medicine

## 2022-10-16 NOTE — Telephone Encounter (Signed)
Copied from CRM [id number]. Topic: Appointment Scheduling - Scheduling Inquiry for Clinic >> Oct 16, 2022  9:30 AM Carrielelia G wrote: Reason for CRM:  please call patient to schedule an appointment  for a cortizone shot thank you

## 2022-10-22 ENCOUNTER — Other Ambulatory Visit (INDEPENDENT_AMBULATORY_CARE_PROVIDER_SITE_OTHER): Payer: BC Managed Care – PPO | Admitting: Radiology

## 2022-10-22 ENCOUNTER — Ambulatory Visit (INDEPENDENT_AMBULATORY_CARE_PROVIDER_SITE_OTHER): Payer: BC Managed Care – PPO | Admitting: Family Medicine

## 2022-10-22 ENCOUNTER — Encounter: Payer: Self-pay | Admitting: Family Medicine

## 2022-10-22 VITALS — BP 130/88 | HR 76 | Ht 67.0 in | Wt 200.0 lb

## 2022-10-22 DIAGNOSIS — M19132 Post-traumatic osteoarthritis, left wrist: Secondary | ICD-10-CM | POA: Diagnosis not present

## 2022-10-22 DIAGNOSIS — E782 Mixed hyperlipidemia: Secondary | ICD-10-CM | POA: Diagnosis not present

## 2022-10-22 DIAGNOSIS — I10 Essential (primary) hypertension: Secondary | ICD-10-CM

## 2022-10-22 DIAGNOSIS — R7401 Elevation of levels of liver transaminase levels: Secondary | ICD-10-CM

## 2022-10-22 DIAGNOSIS — D751 Secondary polycythemia: Secondary | ICD-10-CM

## 2022-10-22 NOTE — Assessment & Plan Note (Addendum)
Had near total symptom control following prior intra-articular injection performed on 04/17/2022 with response until recent several weeks. We reviewed treatment strategies and given his prior response, plan as follows:  - Repeat left radiocarpal intraarticular injection - Post-care reviewed and follow-up reviewed

## 2022-10-22 NOTE — Progress Notes (Signed)
Primary Care / Sports Medicine Office Visit  Patient Information:  Patient ID: Bob Ryan, male DOB: 02/04/1976 Age: 47 y.o. MRN: [id number]   Bob Ryan is a pleasant 47 y.o. male presenting with the following:  Chief Complaint  Patient presents with   Chronic pain of left wrist    Vitals:   10/22/22 1441  BP: 130/88  Pulse: 76  SpO2: 98%   Vitals:   10/22/22 1441  Weight: 200 lb (90.7 kg)  Height: 5\' 7"  (1.702 m)   Body mass index is 31.32 kg/m.  No results found.   Independent interpretation of notes and tests performed by another provider:   None  Procedures performed:   Procedure:  Injection of left wrist radiocarpal joint under ultrasound guidance. Ultrasound guidance utilized for out of plane approach to left wrist radiocarpal joint between the scaphoid and radius that were dynamically visualized, no effusion Samsung HS60 device utilized with permanent recording / reporting. Verbal informed consent obtained and verified. Skin prepped in a sterile fashion. Ethyl chloride for topical local analgesia.  Completed without difficulty and tolerated well. Medication: triamcinolone acetonide 40 mg/mL suspension for injection 1 mL total and 2 mL lidocaine 1% without epinephrine utilized for needle placement anesthetic Advised to contact for fevers/chills, erythema, induration, drainage, or persistent bleeding.  Pertinent History, Exam, Impression, and Recommendations:   Bob Ryan was seen today for chronic pain of left wrist.  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    Assessment & Plan: Had near total symptom control following prior intra-articular injection performed on 04/17/2022 with response until recent several weeks. We reviewed treatment strategies and given his prior response, plan as follows:  - Repeat left radiocarpal intraarticular injection - Post-care reviewed and follow-up reviewed  Orders: -     [place] LIMITED JOINT SPACE STRUCTURES UP LEFT; Future  Hypertension, unspecified type Assessment & Plan: Chronic, improved control, has been compliant with medications. Denies cardiopulmonary complaints. Has visit with cardiology upcoming, has not had a chance to obtain requested labs as of yet.  Plan as follows: - Order additional labs for patient to have drawn alongside - Encouraged continued lifestyle changes - Maintain follow-up with cardiology  Orders: -     Apo A1 + B + Ratio -     Comprehensive metabolic panel -     Lipid panel -     CBC  Mixed hyperlipidemia -     Apo A1 + B + Ratio -     Comprehensive metabolic panel -     Lipid panel  Polycythemia -     CBC -     Testosterone,Free and Total  Transaminitis -     Comprehensive metabolic panel     Orders & Medications No orders of the defined types were placed in this encounter.  Orders Placed This Encounter  Procedures   [place] LIMITED JOINT SPACE STRUCTURES UP LEFT   Apo A1 + B + Ratio   Comprehensive metabolic panel   Lipid panel   CBC   Testosterone,Free and Total     No follow-ups on file.     Jerrol Banana, MD, CAQSM   Primary  Care Sports Medicine Primary Care and Sports Medicine at [facility]

## 2022-10-22 NOTE — Assessment & Plan Note (Signed)
Chronic, improved control, has been compliant with medications. Denies cardiopulmonary complaints. Has visit with cardiology upcoming, has not had a chance to obtain requested labs as of yet.  Plan as follows: - Order additional labs for patient to have drawn alongside - Encouraged continued lifestyle changes - Maintain follow-up with cardiology

## 2022-10-22 NOTE — Patient Instructions (Signed)
You have just been given a cortisone injection to reduce pain and inflammation. After the injection you may notice immediate relief of pain as a result of the Lidocaine. It is important to rest the area of the injection for 24 to 48 hours after the injection. There is a possibility of some temporary increased discomfort and swelling for up to 72 hours until the cortisone begins to work. If you do have pain, simply rest the joint and use ice. If you can tolerate over the counter medications, you can try Tylenol, Aleve, or Advil for added relief per package instructions. 

## 2022-10-30 ENCOUNTER — Ambulatory Visit: Payer: BC Managed Care – PPO | Admitting: Cardiology

## 2022-11-20 ENCOUNTER — Telehealth: Payer: Self-pay | Admitting: Family Medicine

## 2022-11-20 NOTE — Telephone Encounter (Signed)
Referral Request - Has patient seen PCP for this complaint? yes *If NO, is insurance requiring patient see PCP for this issue before PCP can refer them? Referral for which specialty: Ortho?  Preferred provider/office: Dr Ashley Royalty mentioned Dr Rosanne Gutting in [facility] for him Reason for referral: wants a second  opinion about being told needs dish replacement n his neck

## 2022-11-21 ENCOUNTER — Other Ambulatory Visit: Payer: Self-pay

## 2022-11-21 ENCOUNTER — Telehealth: Payer: Self-pay

## 2022-11-21 DIAGNOSIS — M50122 Cervical disc disorder at C5-C6 level with radiculopathy: Secondary | ICD-10-CM

## 2022-11-21 DIAGNOSIS — M4722 Other spondylosis with radiculopathy, cervical region: Secondary | ICD-10-CM

## 2022-11-21 DIAGNOSIS — M50121 Cervical disc disorder at C4-C5 level with radiculopathy: Secondary | ICD-10-CM

## 2022-11-21 NOTE — Progress Notes (Signed)
Ref placed.

## 2022-11-21 NOTE — Telephone Encounter (Signed)
Called pt and left message for him to return my call 

## 2022-11-21 NOTE — Telephone Encounter (Signed)
Please advise 

## 2022-11-21 NOTE — Telephone Encounter (Signed)
Patient called back returning Tara's call, please advise & call patient back @ [phone].

## 2022-11-28 ENCOUNTER — Inpatient Hospital Stay
Admission: RE | Admit: 2022-11-28 | Discharge: 2022-11-28 | Disposition: A | Discharge status: Home / Self Care | Payer: Self-pay | Source: Ambulatory Visit | Attending: Orthopedic Surgery | Admitting: Orthopedic Surgery

## 2022-11-28 ENCOUNTER — Other Ambulatory Visit: Payer: Self-pay

## 2022-11-28 DIAGNOSIS — Z049 Encounter for examination and observation for unspecified reason: Secondary | ICD-10-CM

## 2022-12-17 ENCOUNTER — Ambulatory Visit: Payer: BC Managed Care – PPO | Admitting: Family Medicine

## 2023-02-19 ENCOUNTER — Encounter: Payer: BC Managed Care – PPO | Admitting: Family Medicine

## 2023-02-19 ENCOUNTER — Telehealth: Payer: Self-pay | Admitting: Family Medicine

## 2023-02-19 NOTE — Telephone Encounter (Signed)
Called patient to inform patient his missed his CPE this morning with Dr. Ashley Royalty. Left voicemail.

## 2023-05-07 IMAGING — CR DG CHEST 2V
2 series · 2 of 2 positions shown · non-contrast
Comparison: None.

CLINICAL DATA: Cough and fever

EXAM:
CHEST - 2 VIEW

[chest pa]
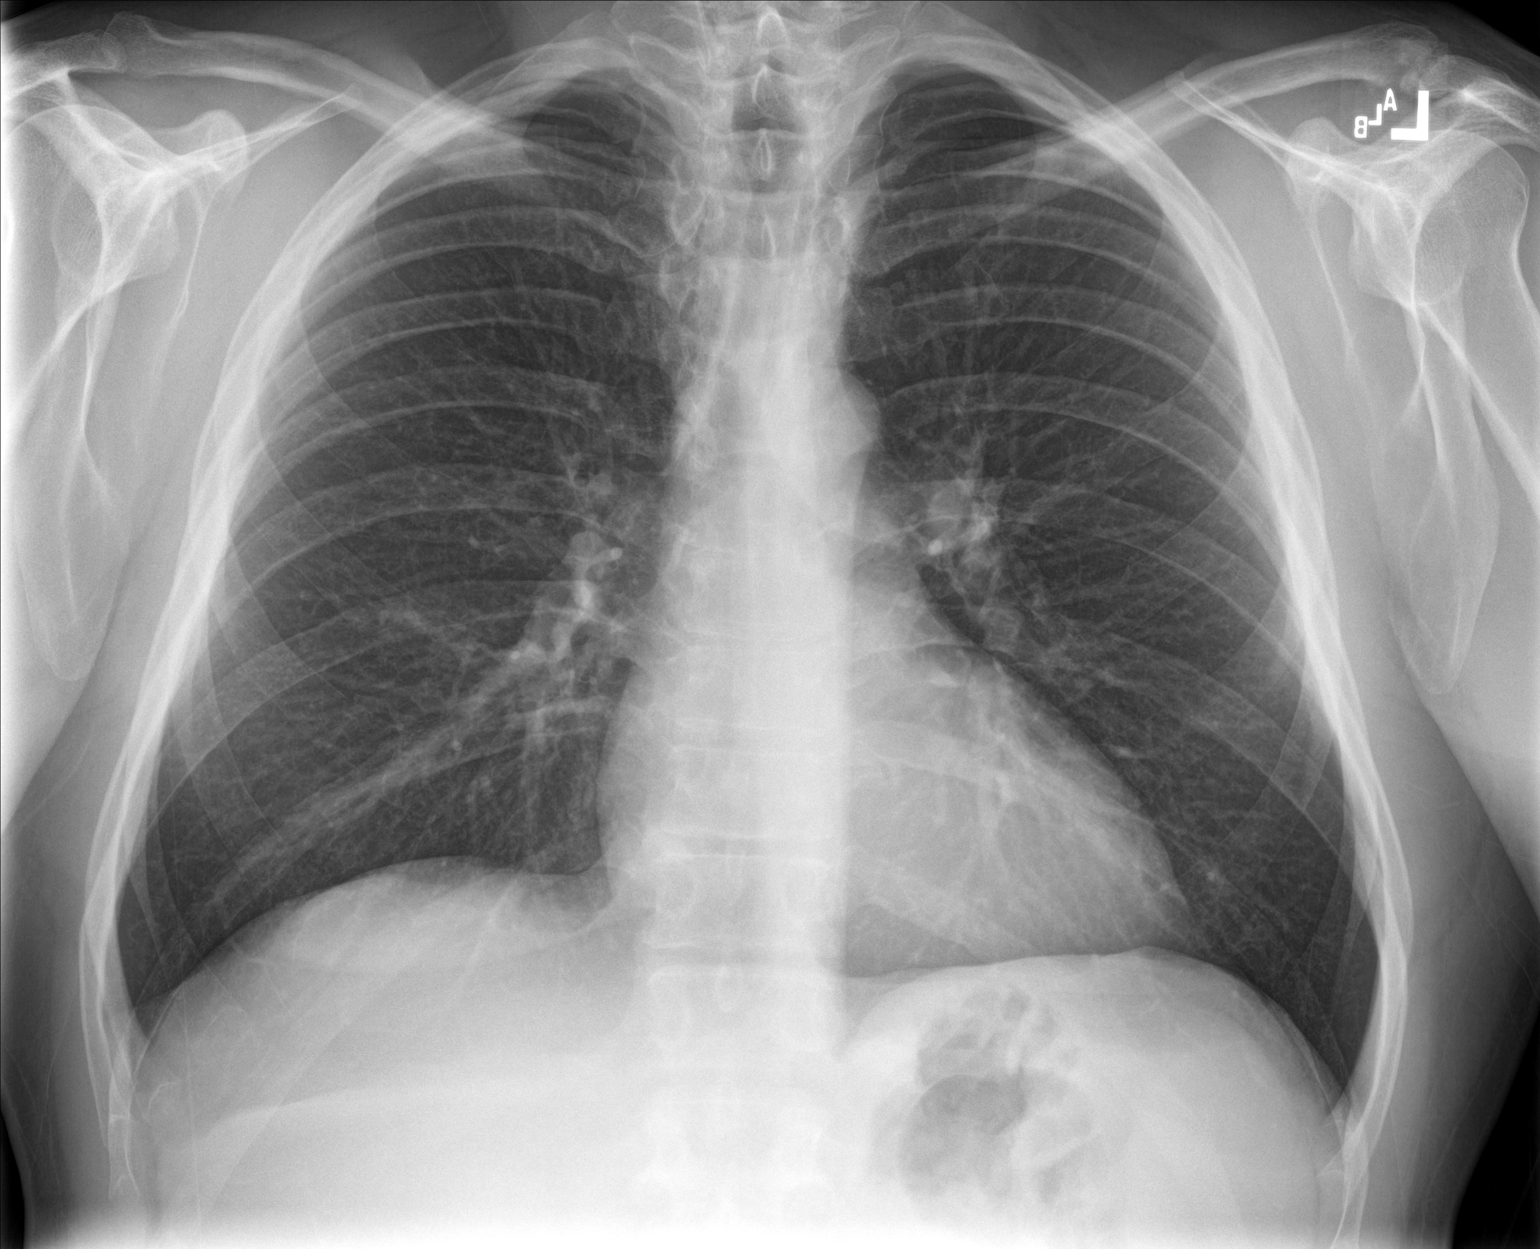

[chest lat]
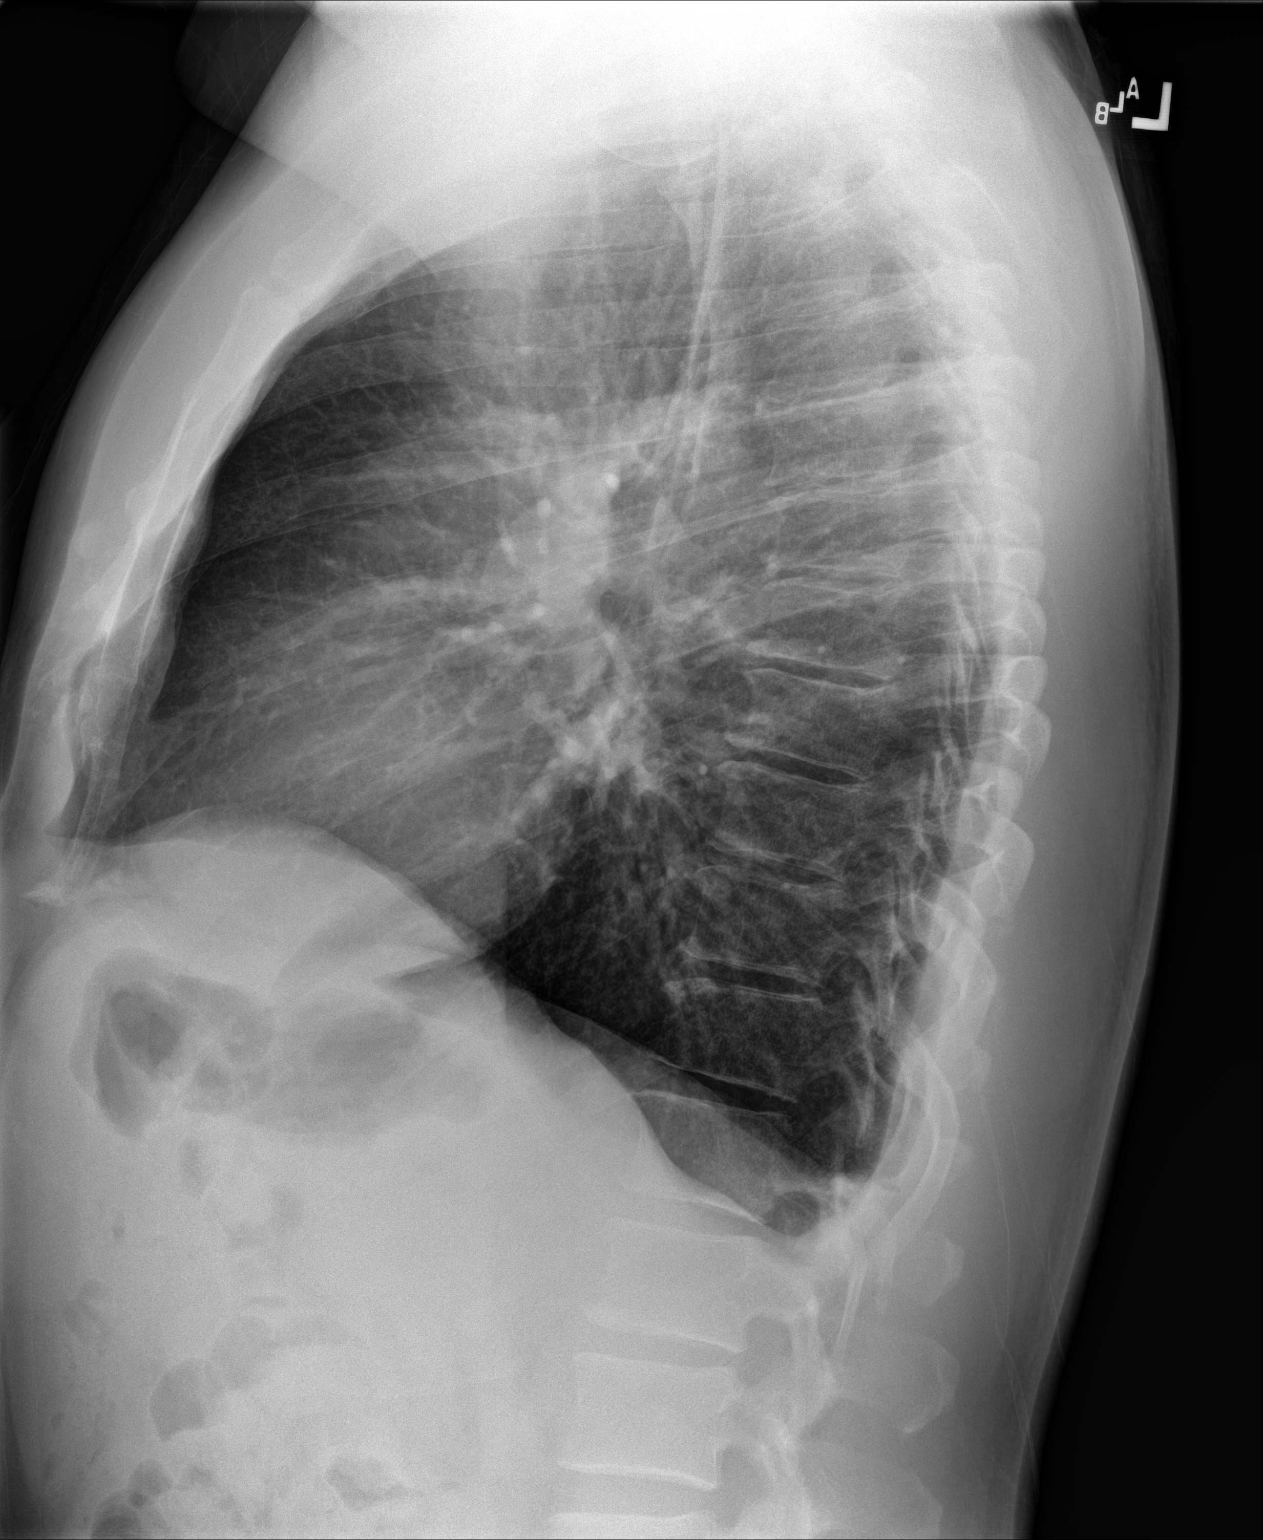

[2 of 2 positions shown; findings below may reference images not displayed]

FINDINGS: The heart size and mediastinal contours are within normal limits.
Both lungs are clear. The visualized skeletal structures are
unremarkable.
IMPRESSION: No active cardiopulmonary disease.

## 2023-06-10 ENCOUNTER — Ambulatory Visit: Payer: BC Managed Care – PPO | Admitting: Family Medicine

## 2023-06-10 ENCOUNTER — Encounter: Payer: Self-pay | Admitting: Family Medicine

## 2023-06-10 VITALS — BP 124/78 | HR 80 | Ht 67.0 in | Wt 204.2 lb

## 2023-06-10 DIAGNOSIS — N400 Enlarged prostate without lower urinary tract symptoms: Secondary | ICD-10-CM

## 2023-06-10 DIAGNOSIS — M5412 Radiculopathy, cervical region: Secondary | ICD-10-CM

## 2023-06-10 DIAGNOSIS — I10 Essential (primary) hypertension: Secondary | ICD-10-CM

## 2023-06-10 DIAGNOSIS — Z1159 Encounter for screening for other viral diseases: Secondary | ICD-10-CM

## 2023-06-10 DIAGNOSIS — M19132 Post-traumatic osteoarthritis, left wrist: Secondary | ICD-10-CM

## 2023-06-10 DIAGNOSIS — E782 Mixed hyperlipidemia: Secondary | ICD-10-CM

## 2023-06-10 DIAGNOSIS — Z0000 Encounter for general adult medical examination without abnormal findings: Secondary | ICD-10-CM | POA: Diagnosis not present

## 2023-06-10 DIAGNOSIS — R7309 Other abnormal glucose: Secondary | ICD-10-CM

## 2023-06-10 NOTE — Progress Notes (Signed)
Annual Physical Exam Visit  Patient Information:  Patient ID: Bob Ryan, male DOB: 07/10/1975 Age: 48 y.o. MRN: [id number]   Subjective:   CC: Annual Physical Exam  HPI:  Bob Ryan is here for their annual physical.  I reviewed the past medical history, family history, social history, surgical history, and allergies today and changes were made as necessary.  Please see the problem list section below for additional details.  Past Medical History: Past Medical History:  Diagnosis Date   Hypertension    Past Surgical History: Past Surgical History:  Procedure Laterality Date   COLONOSCOPY WITH PROPOFOL N/A 05/13/2022   Procedure: COLONOSCOPY WITH PROPOFOL;  Surgeon: Midge Minium, MD;  Location: [facility];  Service: Endoscopy;  Laterality: N/A;   Family History: Family History  Problem Relation Age of Onset   Healthy Mother    Healthy Father    Allergies: No Known Allergies Health Maintenance: Health Maintenance  Topic Date Due   HIV Screening  Never done   Hepatitis C Screening  Never done   INFLUENZA VACCINE  Never done   Colonoscopy  05/13/2032   DTaP/Tdap/Td (2 - Td or Tdap) 06/18/2032   HPV VACCINES  Aged Out   COVID-19 Vaccine  Discontinued    HM Colonoscopy          Upcoming     Colonoscopy (Every 10 Years) Next due on 05/13/2032    05/13/2022  COLONOSCOPY   Only the first 1 history entries have been loaded, but more history exists.               Medications: Current Outpatient Medications on File Prior to Visit  Medication Sig Dispense Refill   rosuvastatin (CRESTOR) 20 MG tablet Take 1 tablet (20 mg total) by mouth daily. (Patient not taking: Reported on 06/10/2023) 90 tablet 3   valsartan-hydrochlorothiazide (DIOVAN-HCT) 80-12.5 MG tablet Take 1 tablet by mouth daily. (Patient not taking: Reported on 06/10/2023) 90 tablet 1   No current facility-administered medications on file prior to visit.      Objective:    Vitals:   06/10/23 0758  BP: 124/78  Pulse: 80  SpO2: 96%   Vitals:   06/10/23 0758  Weight: 204 lb 3.2 oz (92.6 kg)  Height: 5\' 7"  (1.702 m)   Body mass index is 31.98 kg/m.  General: Well Developed, well nourished, and in no acute distress.  Neuro: Alert and oriented x3, extra-ocular muscles intact, sensation grossly intact. Cranial nerves II through XII are grossly intact, motor, sensory, and coordinative functions are intact. HEENT: Normocephalic, atraumatic, neck supple, no masses, no lymphadenopathy, thyroid nonenlarged. Oropharynx, nasopharynx, external ear canals are unremarkable. Skin: Warm and dry, no rashes noted.  Cardiac: Regular rate and rhythm, no murmurs rubs or gallops. No peripheral edema. Pulses symmetric. Respiratory: Clear to auscultation bilaterally. Speaking in full sentences.  Abdominal: Soft, nontender, nondistended, positive bowel sounds, no masses, no organomegaly. Musculoskeletal: Stable, and with full range of motion.   Impression and Recommendations:   The patient was counselled, risk factors were discussed, and anticipatory guidance given.  Problem List Items Addressed This Visit     Cervical radiculopathy   He has a history of neck and shoulder issues, with previous advice from orthopedics to undergo surgery for cervical spine disc replacement due to reported 70% nerve damage affecting the right upper extremity. However, he has not pursued surgery and reports improvement in symptoms, including reduced tingling and numbness and no pain. He experiences  some weakness and reduced strength in the shoulder, particularly when lifting weights, but it does not significantly interfere with daily highly active baseline. Increased activity in the gym has helped with his overall condition. He reports no significant pain in the neck currently and has not had any injections in the spine. He has seen multiple surgeons for his neck and shoulder issues but has not  proceeded with any surgical interventions.  Cervical Spine and Shoulder Injury Improvement in symptoms with increased range of motion and decreased numbness. Noted weakness in the affected arm. Discussed previous recommendations for cervical spine surgery and shoulder repair, but patient has declined and is managing with physical activity, demonstrating progress. -Continue current management strategy. -Offered referral for re-evaluation by orthopedic specialists if symptoms worsen or interfere with daily activities.  Also for interventional spine if pain were to recur.      Healthcare maintenance - Primary   Hypertension   High blood pressure, which is currently well-controlled without medication. He attributes this improvement to increased physical activity.  He denies any active cardiopulmonary complaints.  Hypertension Well-controlled, most likely due to increased physical activity. -Continue current lifestyle modifications. -Risk stratification labs ordered.      Relevant Orders   CBC   Comprehensive metabolic panel   Lipid panel   Mixed hyperlipidemia   Hyperlipidemia Discussed the role of lifestyle and genetics in cholesterol levels. Awaiting current lab results to determine need for medication as patient has self discontinued.  We discussed ASCVD risk stratification. -Review lab results and calculate cardiovascular risk score to guide decision on cholesterol medication.      Relevant Orders   Lipid panel   Post-traumatic osteoarthritis of left wrist (Chronic)   He has been experiencing severe wrist pain with discoloration and redness at the dorsum of the left wrist.  This is an acute on chronic issue in the setting of known posttraumatic osteoarthritis of the left wrist.  He often wears a brace to manage the discomfort. He has received two cortisone injections in the past, approximately six months apart, which provided temporary relief. He is not currently considering surgery  for the wrist.  Posttraumatic osteoarthritis left wrist Chronic wrist pain with discoloration at the joint line, likely due to inflammation. Previous cortisone injections provided relief. -Consider another cortisone injection if pain becomes unmanageable. -Consider referral to orthopedic specialist for potential surgical intervention if pain continues to worsen.      Other Visit Diagnoses       Abnormal glucose       Relevant Orders   Comprehensive metabolic panel   Hemoglobin A1c     Screening for viral disease       Relevant Orders   Hepatitis C antibody   HIV Antibody (routine testing w rflx)     Benign prostatic hyperplasia, unspecified whether lower urinary tract symptoms present       Relevant Orders   PSA Total (Reflex To Free)        Orders & Medications Medications: No orders of the defined types were placed in this encounter.  Orders Placed This Encounter  Procedures   CBC   Comprehensive metabolic panel   Hemoglobin A1c   Hepatitis C antibody   HIV Antibody (routine testing w rflx)   Lipid panel   PSA Total (Reflex To Free)     Return in about 1 year (around 06/09/2024) for CPE.    Jerrol Banana, MD, [facility]   Primary Care Sports Medicine Primary Care and Sports Medicine  at [facility]

## 2023-06-10 NOTE — Patient Instructions (Addendum)
-   Obtain fasting labs with orders provided (can have water or black coffee but otherwise no food or drink x 8 hours before labs) - Review information provided - Attend eye doctor annually, dentist every 6 months, work towards or maintain 30 minutes of moderate intensity physical activity at least 5 days per week, and consume a balanced diet - Return in 1 year for physical - Contact us for any questions between now and then   Your Plan:  1. Cervical Spine and Shoulder Injury:    - Continue with your current management strategy as symptoms have improved.    - Consider seeing an orthopedic specialist if symptoms worsen or interfere with daily activities.  2. Wrist Pain:    - Chronic wrist pain with discoloration and redness likely due to inflammation.    - Consider a cortisone injection if pain becomes unmanageable.    - Referral to an orthopedic specialist if pain continues to worsen for potential surgical intervention.  3. Hypertension:    - Your high blood pressure is well-controlled, likely due to increased physical activity.    - Continue with current lifestyle modifications to maintain control.  4. Hyperlipidemia:    - Awaiting lab results to determine if medication is needed.    - Will review results and calculate cardiovascular risk score to guide decision on cholesterol medication.

## 2023-06-10 NOTE — Assessment & Plan Note (Signed)
High blood pressure, which is currently well-controlled without medication. He attributes this improvement to increased physical activity.  He denies any active cardiopulmonary complaints.  Hypertension Well-controlled, most likely due to increased physical activity. -Continue current lifestyle modifications. -Risk stratification labs ordered.

## 2023-06-10 NOTE — Assessment & Plan Note (Signed)
He has been experiencing severe wrist pain with discoloration and redness at the dorsum of the left wrist.  This is an acute on chronic issue in the setting of known posttraumatic osteoarthritis of the left wrist.  He often wears a brace to manage the discomfort. He has received two cortisone injections in the past, approximately six months apart, which provided temporary relief. He is not currently considering surgery for the wrist.  Posttraumatic osteoarthritis left wrist Chronic wrist pain with discoloration at the joint line, likely due to inflammation. Previous cortisone injections provided relief. -Consider another cortisone injection if pain becomes unmanageable. -Consider referral to orthopedic specialist for potential surgical intervention if pain continues to worsen.

## 2023-06-10 NOTE — Assessment & Plan Note (Signed)
He has a history of neck and shoulder issues, with previous advice from orthopedics to undergo surgery for cervical spine disc replacement due to reported 70% nerve damage affecting the right upper extremity. However, he has not pursued surgery and reports improvement in symptoms, including reduced tingling and numbness and no pain. He experiences some weakness and reduced strength in the shoulder, particularly when lifting weights, but it does not significantly interfere with daily highly active baseline. Increased activity in the gym has helped with his overall condition. He reports no significant pain in the neck currently and has not had any injections in the spine. He has seen multiple surgeons for his neck and shoulder issues but has not proceeded with any surgical interventions.  Cervical Spine and Shoulder Injury Improvement in symptoms with increased range of motion and decreased numbness. Noted weakness in the affected arm. Discussed previous recommendations for cervical spine surgery and shoulder repair, but patient has declined and is managing with physical activity, demonstrating progress. -Continue current management strategy. -Offered referral for re-evaluation by orthopedic specialists if symptoms worsen or interfere with daily activities.  Also for interventional spine if pain were to recur.

## 2023-06-10 NOTE — Assessment & Plan Note (Signed)
Hyperlipidemia Discussed the role of lifestyle and genetics in cholesterol levels. Awaiting current lab results to determine need for medication as patient has self discontinued.  We discussed ASCVD risk stratification. -Review lab results and calculate cardiovascular risk score to guide decision on cholesterol medication.

## 2023-06-11 ENCOUNTER — Encounter: Payer: Self-pay | Admitting: Family Medicine

## 2023-06-11 LAB — COMPREHENSIVE METABOLIC PANEL
ALT: 34 [IU]/L (ref 0–44)
AST: 31 [IU]/L (ref 0–40)
Albumin: 4.7 g/dL (ref 4.1–5.1)
Alkaline Phosphatase: 79 [IU]/L (ref 44–121)
BUN/Creatinine Ratio: 10 (ref 9–20)
BUN: 11 mg/dL (ref 6–24)
Bilirubin Total: 0.6 mg/dL (ref 0.0–1.2)
CO2: 23 mmol/L (ref 20–29)
Calcium: 9.7 mg/dL (ref 8.7–10.2)
Chloride: 101 mmol/L (ref 96–106)
Creatinine, Ser: 1.08 mg/dL (ref 0.76–1.27)
Globulin, Total: 2.5 g/dL (ref 1.5–4.5)
Glucose: 105 mg/dL — ABNORMAL HIGH (ref 70–99)
Potassium: 5.2 mmol/L (ref 3.5–5.2)
Sodium: 139 mmol/L (ref 134–144)
Total Protein: 7.2 g/dL (ref 6.0–8.5)
eGFR: 85 mL/min/{1.73_m2} (ref >59)

## 2023-06-11 LAB — LIPID PANEL
Chol/HDL Ratio: 5.5 {ratio} — ABNORMAL HIGH (ref 0.0–5.0)
Cholesterol, Total: 237 mg/dL — ABNORMAL HIGH (ref 100–199)
HDL: 43 mg/dL (ref >39)
LDL Chol Calc (NIH): 161 mg/dL — ABNORMAL HIGH (ref 0–99)
Triglycerides: 177 mg/dL — ABNORMAL HIGH (ref 0–149)
VLDL Cholesterol Cal: 33 mg/dL (ref 5–40)

## 2023-06-11 LAB — CBC
Hematocrit: 53.2 % — ABNORMAL HIGH (ref 37.5–51.0)
Hemoglobin: 17.6 g/dL (ref 13.0–17.7)
MCH: 30.6 pg (ref 26.6–33.0)
MCHC: 33.1 g/dL (ref 31.5–35.7)
MCV: 92 fL (ref 79–97)
Platelets: 228 10*3/uL (ref 150–450)
RBC: 5.76 x10E6/uL (ref 4.14–5.80)
RDW: 12.4 % (ref 11.6–15.4)
WBC: 4.1 10*3/uL (ref 3.4–10.8)

## 2023-06-11 LAB — PSA TOTAL (REFLEX TO FREE): Prostate Specific Ag, Serum: 1.2 ng/mL (ref 0.0–4.0)

## 2023-06-11 LAB — HEPATITIS C ANTIBODY: Hep C Virus Ab: NONREACTIVE

## 2023-06-11 LAB — HIV ANTIBODY (ROUTINE TESTING W REFLEX): HIV Screen 4th Generation wRfx: NONREACTIVE

## 2023-06-11 LAB — HEMOGLOBIN A1C
Est. average glucose Bld gHb Est-mCnc: 111 mg/dL
Hgb A1c MFr Bld: 5.5 % (ref 4.8–5.6)

## 2023-06-12 NOTE — Telephone Encounter (Signed)
Please review

## 2023-07-16 ENCOUNTER — Ambulatory Visit (INDEPENDENT_AMBULATORY_CARE_PROVIDER_SITE_OTHER): Admitting: Family Medicine

## 2023-07-16 ENCOUNTER — Encounter: Payer: Self-pay | Admitting: Family Medicine

## 2023-07-16 ENCOUNTER — Other Ambulatory Visit (INDEPENDENT_AMBULATORY_CARE_PROVIDER_SITE_OTHER): Payer: Self-pay | Admitting: Radiology

## 2023-07-16 VITALS — BP 150/108 | HR 93 | Ht 67.0 in | Wt 203.0 lb

## 2023-07-16 DIAGNOSIS — M19132 Post-traumatic osteoarthritis, left wrist: Secondary | ICD-10-CM

## 2023-07-16 DIAGNOSIS — E782 Mixed hyperlipidemia: Secondary | ICD-10-CM

## 2023-07-16 DIAGNOSIS — I10 Essential (primary) hypertension: Secondary | ICD-10-CM

## 2023-07-16 MED ORDER — MELOXICAM 15 MG PO TABS
15.0000 mg | ORAL_TABLET | Freq: Every day | ORAL | 2 refills | Status: DC | PRN
Start: 2023-07-16 — End: 2024-01-05

## 2023-07-16 MED ORDER — ROSUVASTATIN CALCIUM 20 MG PO TABS
20.0000 mg | ORAL_TABLET | Freq: Every day | ORAL | 3 refills | Status: DC
Start: 2023-07-16 — End: 2023-12-23

## 2023-07-16 MED ORDER — VALSARTAN-HYDROCHLOROTHIAZIDE 80-12.5 MG PO TABS
1.0000 | ORAL_TABLET | Freq: Every day | ORAL | 3 refills | Status: DC
Start: 2023-07-16 — End: 2023-12-23

## 2023-07-16 MED ORDER — TRIAMCINOLONE ACETONIDE 40 MG/ML IJ SUSP
40.0000 mg | Freq: Once | INTRAMUSCULAR | Status: AC
Start: 2023-07-16 — End: 2023-07-16
  Administered 2023-07-16: 40 mg via INTRAMUSCULAR

## 2023-07-16 NOTE — Progress Notes (Signed)
 Primary Care / Sports Medicine Office Visit  Patient Information:  Patient ID: Bob Ryan, male DOB: 1975-07-27 Age: 48 y.o. MRN: [id number]   Bob Ryan is a pleasant 48 y.o. male presenting with the following:  Chief Complaint  Patient presents with   Wrist Pain    Pain in left wrist x 4-5 months. Patient would like another cortisone injection today. His last injection lasted for 3 months.     Vitals:   07/16/23 1047 07/16/23 1055  BP: (!) 150/110 (!) 150/108  Pulse: 93   SpO2: 98%    Vitals:   07/16/23 1047  Weight: 203 lb (92.1 kg)  Height: 5\' 7"  (1.702 m)   Body mass index is 31.79 kg/m.  No results found.   Independent interpretation of notes and tests performed by another provider:   None  Procedures performed:   Procedure:  Injection of left wrist under ultrasound guidance. Ultrasound guidance utilized for out of plane approach to the dorsal aspect of the radiocarpal joint, cortical irregularities consistent with degenerative changes noted sonographically Samsung HS60 device utilized with permanent recording / reporting. Verbal informed consent obtained and verified. Skin prepped in a sterile fashion. Ethyl chloride for topical local analgesia.  Completed without difficulty and tolerated well. Medication: triamcinolone acetonide 40 mg/mL suspension for injection 1 mL total and 1 mL lidocaine 1% without epinephrine utilized for needle placement anesthetic Advised to contact for fevers/chills, erythema, induration, drainage, or persistent bleeding.   Pertinent History, Exam, Impression, and Recommendations:   Problem List Items Addressed This Visit     Hypertension   Hypertension Hypertension with recent lapse in medication adherence. Emphasized importance of adherence for effective management. Will provide a year's supply of medication. - Send prescriptions for blood pressure medications to the pharmacy, ensuring a year's supply is  available. - Encourage lifestyle changes.      Relevant Medications   valsartan-hydrochlorothiazide (DIOVAN-HCT) 80-12.5 MG tablet   rosuvastatin (CRESTOR) 20 MG tablet   Mixed hyperlipidemia   Hyperlipidemia Hyperlipidemia managed with Crestor. Agreeable to continuing current management. - Send prescription for Crestor to the pharmacy.      Relevant Medications   valsartan-hydrochlorothiazide (DIOVAN-HCT) 80-12.5 MG tablet   rosuvastatin (CRESTOR) 20 MG tablet   Post-traumatic osteoarthritis of left wrist - Primary (Chronic)   History of Present Illness Bob Ryan is a 48 year old male who presents with left wrist pain.  He is right-hand dominant.  He has been experiencing persistent left wrist pain that has worsened over time, significant enough to wake him at night, especially if the wrist is moved or bumped. He has been using a wrist brace at night to alleviate discomfort, but now finds it necessary to wear it constantly due to the pain.  He has had prior imaging demonstrating the following: IMPRESSION: 1. Age-indeterminate fracture of the proximal pole of the scaphoid. 2. Minimal triscaphe and thumb carpometacarpal osteoarthritis. He has previously received intra-articular injections that provided relief for about six to eight months. He takes two to three Advil in the morning and occasionally at night when the pain is severe, such as on Monday night. He has not been taking any other medications for the wrist pain until now.  The pain impacts his daily activities, especially at work where he is involved with equipment. He describes incidents where sudden movements or accidental bumps cause severe pain, almost bringing him to his knees. He is right-hand dominant, but the left wrist pain is significant  due to previous shoulder issues on the right side, making the left hand more critical for certain tasks.  Physical Exam INSPECTION: Increased erythema in the left wrist region.   Radial deformation noted. PALPATION: Tenderness at the radiocarpal junction, noted crepitus, limited ROM due to pain.  Assessment and Plan Left wrist posttraumatic arthritis Chronic left wrist arthritis with significant pain and functional limitation. Previous corticosteroid injections provided relief, but current episode more severe. Discussed utility of surgical evaluation for additional management strategies given the patient's young age and high level of necessary work related activity. Meloxicam prescribed for pain management. Referral to orthopedics planned for surgical evaluation post-injection in roughly 2-3 months. - Administer corticosteroid injection to the left wrist under ultrasound guidance. - Prescribe meloxicam 15 mg once daily as needed for pain management. - Refer to orthopedic surgery for evaluation of potential surgical options, with follow-up in approximately three months. - Advise continued use of wrist brace during work hours and at night for the next two days.      Relevant Medications   triamcinolone acetonide (KENALOG-40) injection 40 mg   meloxicam (MOBIC) 15 MG tablet   Other Relevant Orders   [place] LIMITED JOINT SPACE STRUCTURES UP LEFT   Ambulatory referral to Orthopedic Surgery     Orders & Medications Medications:  Meds ordered this encounter  Medications   triamcinolone acetonide (KENALOG-40) injection 40 mg   valsartan-hydrochlorothiazide (DIOVAN-HCT) 80-12.5 MG tablet    Sig: Take 1 tablet by mouth daily.    Dispense:  90 tablet    Refill:  3   rosuvastatin (CRESTOR) 20 MG tablet    Sig: Take 1 tablet (20 mg total) by mouth daily.    Dispense:  90 tablet    Refill:  3   meloxicam (MOBIC) 15 MG tablet    Sig: Take 1 tablet (15 mg total) by mouth daily as needed for pain.    Dispense:  30 tablet    Refill:  2   Orders Placed This Encounter  Procedures   [place] LIMITED JOINT SPACE STRUCTURES UP LEFT   Ambulatory referral to Orthopedic Surgery      No follow-ups on file.     Jerrol Banana, MD, [facility]   Primary Care Sports Medicine Primary Care and Sports Medicine at [facility]

## 2023-07-16 NOTE — Assessment & Plan Note (Signed)
 Hyperlipidemia Hyperlipidemia managed with Crestor. Agreeable to continuing current management. - Send prescription for Crestor to the pharmacy.

## 2023-07-16 NOTE — Patient Instructions (Signed)
 You have just been given a cortisone injection to reduce pain and inflammation. After the injection you may notice immediate relief of pain as a result of the Lidocaine. It is important to rest the area of the injection for 24 to 48 hours after the injection. There is a possibility of some temporary increased discomfort and swelling for up to 72 hours until the cortisone begins to work. If you do have pain, simply rest the joint and use ice. If you can tolerate over the counter medications, you can try Tylenol, Aleve, or Advil for added relief per package instructions.   Patient Care Plan  Hypertension  1. Ensure you have a year's supply of your blood pressure medication, valsartan-hydrochlorothiazide (DIOVAN-HCT) 80-12.5 mg. The prescription has been sent to your pharmacy. 2. Follow lifestyle changes recommended by your healthcare provider to help manage your blood pressure.  Mixed Hyperlipidemia  1. Continue taking rosuvastatin (CRESTOR) 20 mg as prescribed. The prescription has been sent to your pharmacy.  Left Wrist Post-Traumatic Osteoarthritis  1. Receive a corticosteroid injection in your left wrist under ultrasound guidance to help relieve pain. 2. Take meloxicam 15 mg once daily as needed for pain management. 3. Continue using your wrist brace during work hours and at night for the next two days. 4. You have been referred to orthopedic surgery for evaluation of potential surgical options. Follow up in approximately three months.  Red Flags: - If you experience increased swelling, redness, or severe pain in your wrist, contact your healthcare provider immediately.  Please adhere to these steps and reach out to your healthcare provider if you have any questions or concerns regarding your treatment plan.

## 2023-07-16 NOTE — Assessment & Plan Note (Signed)
 History of Present Illness Bob Ryan is a 48 year old male who presents with left wrist pain.  He is right-hand dominant.  He has been experiencing persistent left wrist pain that has worsened over time, significant enough to wake him at night, especially if the wrist is moved or bumped. He has been using a wrist brace at night to alleviate discomfort, but now finds it necessary to wear it constantly due to the pain.  He has had prior imaging demonstrating the following: IMPRESSION: 1. Age-indeterminate fracture of the proximal pole of the scaphoid. 2. Minimal triscaphe and thumb carpometacarpal osteoarthritis. He has previously received intra-articular injections that provided relief for about six to eight months. He takes two to three Advil in the morning and occasionally at night when the pain is severe, such as on Monday night. He has not been taking any other medications for the wrist pain until now.  The pain impacts his daily activities, especially at work where he is involved with equipment. He describes incidents where sudden movements or accidental bumps cause severe pain, almost bringing him to his knees. He is right-hand dominant, but the left wrist pain is significant due to previous shoulder issues on the right side, making the left hand more critical for certain tasks.  Physical Exam INSPECTION: Increased erythema in the left wrist region.  Radial deformation noted. PALPATION: Tenderness at the radiocarpal junction, noted crepitus, limited ROM due to pain.  Assessment and Plan Left wrist posttraumatic arthritis Chronic left wrist arthritis with significant pain and functional limitation. Previous corticosteroid injections provided relief, but current episode more severe. Discussed utility of surgical evaluation for additional management strategies given the patient's young age and high level of necessary work related activity. Meloxicam prescribed for pain management. Referral  to orthopedics planned for surgical evaluation post-injection in roughly 2-3 months. - Administer corticosteroid injection to the left wrist under ultrasound guidance. - Prescribe meloxicam 15 mg once daily as needed for pain management. - Refer to orthopedic surgery for evaluation of potential surgical options, with follow-up in approximately three months. - Advise continued use of wrist brace during work hours and at night for the next two days.

## 2023-07-16 NOTE — Assessment & Plan Note (Signed)
 Hypertension Hypertension with recent lapse in medication adherence. Emphasized importance of adherence for effective management. Will provide a year's supply of medication. - Send prescriptions for blood pressure medications to the pharmacy, ensuring a year's supply is available. - Encourage lifestyle changes.

## 2023-10-09 ENCOUNTER — Ambulatory Visit: Admitting: Orthopedic Surgery

## 2023-10-15 ENCOUNTER — Ambulatory Visit (INDEPENDENT_AMBULATORY_CARE_PROVIDER_SITE_OTHER): Admitting: Orthopedic Surgery

## 2023-10-15 DIAGNOSIS — M19132 Post-traumatic osteoarthritis, left wrist: Secondary | ICD-10-CM

## 2023-10-15 DIAGNOSIS — S62002S Unspecified fracture of navicular [scaphoid] bone of left wrist, sequela: Secondary | ICD-10-CM | POA: Diagnosis not present

## 2023-10-15 NOTE — Patient Instructions (Signed)
 Please call the clinic if you are interested in a referral to a hand specialist

## 2023-10-15 NOTE — Progress Notes (Signed)
 New Patient Visit  Assessment: Bob Ryan is a 48 y.o. male with the following: 1. 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  It has progressively caused the discomfort.  Radiographs demonstrates a scaphoid nonunion.  This is going on to develop arthritis.  Medications are not effective.  He has had multiple injections.  He states he has gotten to the point where he is considering a procedure, as it affects his usual activities.  He has difficulty working out.  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  I had extensive discussion with him in clinic today.  I discussed his condition, and that he has developed a snack wrist.  The procedures to to fix this are extensive reconstructions.  I have recommended a hand specialist.  He states understanding.  He is self-employed, and not interested in surgery at this time, but states he tends to be less busy in the winter.  He may consider surgery in the near future.  I have asked him to call if interested in a referral to a hand specialist.  In addition, I am happy to provide a referral for him to be evaluated in his preferred location including [place], [place] or [place].  Follow-up: Return if symptoms worsen or fail to improve.  Subjective:  Chief Complaint  Patient presents with   Wrist Pain    Left wrist pain    History of Present Illness: Bob Ryan is a 48 y.o. male who has been referred by Bob Ku, MD for evaluation of left wrist pain.  He is right-hand dominant.  He is self-employed.  He states he injured his left wrist over 20 years ago.  Thought it was a sprained wrist, and wrapped his wrist at that time.  His pain got better.  Did not seek treatment.  Recently, he has been evaluated by Dr. Ku.  He has had multiple injections in the left wrist.  He has been diagnosed with arthritis.  He takes ibuprofen daily.  He  states the pain is constant.  It has gotten worse over the past 1-2 years.  He is self-employed.  He notes restricted motion of his left wrist.  It affects his ability to work out.   Review of Systems: No fevers or chills No numbness or tingling No chest pain No shortness of breath No bowel or bladder dysfunction No GI distress No headaches   Medical History:  Past Medical History:  Diagnosis Date   Hypertension     Past Surgical History:  Procedure Laterality Date   COLONOSCOPY WITH PROPOFOL  N/A 05/13/2022   Procedure: COLONOSCOPY WITH PROPOFOL ;  Surgeon: Bob Carmine, MD;  Location: [facility];  Service: Endoscopy;  Laterality: N/A;    Family History  Problem Relation Age of Onset   Healthy Mother    Healthy Father    Social History   Tobacco Use   Smoking status: Never    Passive exposure: Past   Smokeless tobacco: Current    Types: Snuff  Vaping Use   Vaping status: Never Used  Substance Use Topics   Alcohol use: Yes    Alcohol/week: 28.0 standard drinks of alcohol    Types: 28 Cans of beer per week    Comment: average per week   Drug use: Never    No Known Allergies  No outpatient medications have been marked as taking for the 10/15/23 encounter (  Office Visit) with Bob Ryan LABOR, MD.    Objective: There were no vitals taken for this visit.  Physical Exam:  General: Alert and oriented. and No acute distress. Gait: Normal gait.  Left wrist with some swelling on the dorsal aspect.  He has restricted extension.  Tenderness to palpation through the wrist.  He is able to make a fist.  This is affecting his grip strength, which is slightly decreased compared to the contralateral side.  Fingers are warm and well-perfused.  Sensation is intact throughout the left hand.  IMAGING: I personally reviewed images previously obtained in clinic  X-rays of the left wrist were previously obtained.  These are available in clinic today.  There is a nonunion  of the proximal pole of the scaphoid.  There is resultant arthritis within the wrist joint.   New Medications:  No orders of the defined types were placed in this encounter.     Ryan LABOR Onesimo, MD  10/15/2023 4:08 PM

## 2023-10-16 ENCOUNTER — Encounter: Payer: Self-pay | Admitting: Orthopedic Surgery

## 2023-12-23 ENCOUNTER — Encounter: Payer: Self-pay | Admitting: Family Medicine

## 2023-12-23 ENCOUNTER — Ambulatory Visit (INDEPENDENT_AMBULATORY_CARE_PROVIDER_SITE_OTHER): Admitting: Family Medicine

## 2023-12-23 VITALS — BP 144/100 | HR 77 | Ht 67.0 in | Wt 204.0 lb

## 2023-12-23 DIAGNOSIS — K148 Other diseases of tongue: Secondary | ICD-10-CM | POA: Diagnosis not present

## 2023-12-23 DIAGNOSIS — E782 Mixed hyperlipidemia: Secondary | ICD-10-CM

## 2023-12-23 DIAGNOSIS — G4700 Insomnia, unspecified: Secondary | ICD-10-CM | POA: Insufficient documentation

## 2023-12-23 DIAGNOSIS — I10 Essential (primary) hypertension: Secondary | ICD-10-CM | POA: Diagnosis not present

## 2023-12-23 DIAGNOSIS — Z720 Tobacco use: Secondary | ICD-10-CM | POA: Insufficient documentation

## 2023-12-23 DIAGNOSIS — N529 Male erectile dysfunction, unspecified: Secondary | ICD-10-CM | POA: Insufficient documentation

## 2023-12-23 MED ORDER — SILDENAFIL CITRATE 50 MG PO TABS
50.0000 mg | ORAL_TABLET | Freq: Every day | ORAL | 0 refills | Status: DC | PRN
Start: 2023-12-23 — End: 2024-01-20

## 2023-12-23 MED ORDER — ROSUVASTATIN CALCIUM 20 MG PO TABS
20.0000 mg | ORAL_TABLET | Freq: Every day | ORAL | 3 refills | Status: AC
Start: 2023-12-23 — End: ?

## 2023-12-23 MED ORDER — TRAZODONE HCL 50 MG PO TABS
25.0000 mg | ORAL_TABLET | Freq: Every evening | ORAL | 0 refills | Status: AC | PRN
Start: 2023-12-23 — End: ?

## 2023-12-23 MED ORDER — VALSARTAN-HYDROCHLOROTHIAZIDE 80-12.5 MG PO TABS
1.0000 | ORAL_TABLET | Freq: Every day | ORAL | 3 refills | Status: AC
Start: 2023-12-23 — End: ?

## 2023-12-23 NOTE — Assessment & Plan Note (Signed)
 Hypertension - History of hypertension, previously well-controlled with medication (last blood pressure 124/78 mmHg in February) - Currently out of antihypertensive medication for two weeks due to pharmacy misunderstanding - No current antihypertensive therapy - Denies cardiopulmonary complaints  Hypertension Hypertension previously well-controlled on valsartan  HCTZ, currently not on medication. - Prescribe valsartan  HCTZ, 90-day supply with three refills. - Advise to contact the office directly if medication runs out in the future.

## 2023-12-23 NOTE — Assessment & Plan Note (Signed)
 Insomnia - Difficulty initiating and maintaining sleep, averaging 3.5 hours of sleep per night - Frequent early morning awakenings with inability to return to sleep - No use of sleep medications - Interest in addressing sleep disturbance  Insomnia Reports difficulty sleeping, no prior use of sleep aids. - Prescribe trazodone , 50 mg tablets, instruct to start with half a tablet and adjust as needed up to four tablets (25-200 mg), as needed for sleep.

## 2023-12-23 NOTE — Assessment & Plan Note (Signed)
 Oral irritation related to tobacco use - History of chewing tobacco use, recently discontinued after an episode of falling asleep with tobacco in mouth causing oral irritation - No tobacco use since the incident

## 2023-12-23 NOTE — Assessment & Plan Note (Addendum)
 Lingual ulceration and pain - Persistent sore spot on tongue present for several years, initially developed after repeated tongue biting resulting in scar tissue formation - Lesion remained stable for years with biannual dental monitoring and no noted changes - Approximately seven weeks ago, experienced another tongue bite causing significant bleeding and increased soreness - Current symptoms include pain and discomfort localized to the affected area, worsened by accidental biting during chewing - Pain does not interfere with eating unless the area is bitten    Media Information  Document Information  Photos  Left lateral tongue  12/23/2023 11:13  Attached To:  Office Visit on 12/23/23 with Alvia Selinda PARAS, MD  Source Information  Alvia Selinda PARAS, MD  [facility]   Lesion of left lateral tongue Chronic lesion exacerbated by trauma, no concerning changes. Tobacco use is a risk factor. - Refer to ENT for evaluation and possible excision. - Advise to visit ENT office across the hall to expedite scheduling.

## 2023-12-23 NOTE — Patient Instructions (Signed)
 Patient Plan for Post-Visit Guidance  Chewing Tobacco Use and Oral Irritation - Remain tobacco-free to prevent further oral irritation and reduce health risks.  Erectile Dysfunction - Take sildenafil  50 mg as needed, up to one tablet daily for erectile dysfunction.  Hypertension - Resume valsartan  HCTZ as prescribed (90-day supply with refills). - Contact the office directly if you run out of medication in the future.  Insomnia - Take trazodone  50 mg tablets for sleep. Start with half a tablet and adjust as needed, up to four tablets (25-200 mg) nightly.  Lesion of Tongue - Schedule and attend ENT appointment for evaluation and possible excision of the tongue lesion. - Visit the ENT office across the hall to expedite scheduling.  Mixed Hyperlipidemia - Continue taking rosuvastatin  as prescribed.  Red Flags - If you experience severe tongue pain, swelling, bleeding, difficulty swallowing, new or worsening mouth sores, chest pain, shortness of breath, severe headache, or any new or concerning symptoms, seek medical attention promptly.

## 2023-12-23 NOTE — Assessment & Plan Note (Signed)
 Erectile dysfunction - Occasional erectile dysfunction, attributed to stress, alcohol use, and sleep deprivation - Use of sildenafil  obtained online, effective without adverse effects such as headache  Erectile dysfunction Occasional erectile dysfunction, possibly related to stress, alcohol, and lack of sleep. Sildenafil  effective. - Prescribe sildenafil  50 mg, one tablet daily as needed for erectile dysfunction.

## 2023-12-23 NOTE — Progress Notes (Signed)
 Primary Care / Sports Medicine Office Visit  Patient Information:  Patient ID: Bob Ryan, male DOB: January 31, 1976 Age: 48 y.o. MRN: [id number]   Bob Ryan is a pleasant 48 y.o. male presenting with the following:  Chief Complaint  Patient presents with   Mouth Lesions    Patient bit tongue and now has sore on the left side. Would like to know treatment options for removal of sore/ scar tissue.   Hypertension    Patient needs refills on b/p meds.    Vitals:   12/23/23 1052  BP: (!) 144/100  Pulse: 77  SpO2: 99%   Vitals:   12/23/23 1052  Weight: 204 lb (92.5 kg)  Height: 5' 7 (1.702 m)   Body mass index is 31.95 kg/m.  No results found.   Independent interpretation of notes and tests performed by another provider:   None  Procedures performed:   None  Pertinent History, Exam, Impression, and Recommendations:   Problem List Items Addressed This Visit     Chewing tobacco use   Oral irritation related to tobacco use - History of chewing tobacco use, recently discontinued after an episode of falling asleep with tobacco in mouth causing oral irritation - No tobacco use since the incident      Erectile dysfunction   Erectile dysfunction - Occasional erectile dysfunction, attributed to stress, alcohol use, and sleep deprivation - Use of sildenafil  obtained online, effective without adverse effects such as headache  Erectile dysfunction Occasional erectile dysfunction, possibly related to stress, alcohol, and lack of sleep. Sildenafil  effective. - Prescribe sildenafil  50 mg, one tablet daily as needed for erectile dysfunction.      Relevant Medications   sildenafil  (VIAGRA ) 50 MG tablet   Hypertension   Hypertension - History of hypertension, previously well-controlled with medication (last blood pressure 124/78 mmHg in February) - Currently out of antihypertensive medication for two weeks due to pharmacy misunderstanding - No current  antihypertensive therapy - Denies cardiopulmonary complaints  Hypertension Hypertension previously well-controlled on valsartan  HCTZ, currently not on medication. - Prescribe valsartan  HCTZ, 90-day supply with three refills. - Advise to contact the office directly if medication runs out in the future.      Relevant Medications   valsartan -hydrochlorothiazide  (DIOVAN -HCT) 80-12.5 MG tablet   rosuvastatin  (CRESTOR ) 20 MG tablet   sildenafil  (VIAGRA ) 50 MG tablet   Insomnia   Insomnia - Difficulty initiating and maintaining sleep, averaging 3.5 hours of sleep per night - Frequent early morning awakenings with inability to return to sleep - No use of sleep medications - Interest in addressing sleep disturbance  Insomnia Reports difficulty sleeping, no prior use of sleep aids. - Prescribe trazodone , 50 mg tablets, instruct to start with half a tablet and adjust as needed up to four tablets (25-200 mg), as needed for sleep.      Relevant Medications   traZODone  (DESYREL ) 50 MG tablet   Lesion of tongue - Primary   Lingual ulceration and pain - Persistent sore spot on tongue present for several years, initially developed after repeated tongue biting resulting in scar tissue formation - Lesion remained stable for years with biannual dental monitoring and no noted changes - Approximately seven weeks ago, experienced another tongue bite causing significant bleeding and increased soreness - Current symptoms include pain and discomfort localized to the affected area, worsened by accidental biting during chewing - Pain does not interfere with eating unless the area is bitten    Media Information  Document  Information  Photos  Left lateral tongue  12/23/2023 11:13  Attached To:  Office Visit on 12/23/23 with Alvia Selinda PARAS, MD  Source Information  Alvia Selinda PARAS, MD  [facility]   Lesion of left lateral tongue Chronic lesion exacerbated by trauma, no concerning  changes. Tobacco use is a risk factor. - Refer to ENT for evaluation and possible excision. - Advise to visit ENT office across the hall to expedite scheduling.      Relevant Orders   Ambulatory referral to ENT   Mixed hyperlipidemia   Relevant Medications   valsartan -hydrochlorothiazide  (DIOVAN -HCT) 80-12.5 MG tablet   rosuvastatin  (CRESTOR ) 20 MG tablet   sildenafil  (VIAGRA ) 50 MG tablet     Orders & Medications Medications:  Meds ordered this encounter  Medications   valsartan -hydrochlorothiazide  (DIOVAN -HCT) 80-12.5 MG tablet    Sig: Take 1 tablet by mouth daily.    Dispense:  90 tablet    Refill:  3   rosuvastatin  (CRESTOR ) 20 MG tablet    Sig: Take 1 tablet (20 mg total) by mouth daily.    Dispense:  90 tablet    Refill:  3   traZODone  (DESYREL ) 50 MG tablet    Sig: Take 0.5-4 tablets (25-200 mg total) by mouth at bedtime as needed for sleep.    Dispense:  90 tablet    Refill:  0   sildenafil  (VIAGRA ) 50 MG tablet    Sig: Take 1 tablet (50 mg total) by mouth daily as needed for erectile dysfunction.    Dispense:  30 tablet    Refill:  0   Orders Placed This Encounter  Procedures   Ambulatory referral to ENT     No follow-ups on file.     Selinda PARAS Alvia, MD, [facility]   Primary Care Sports Medicine Primary [facility] and [facility] at [facility]

## 2023-12-30 DIAGNOSIS — D3702 Neoplasm of uncertain behavior of tongue: Secondary | ICD-10-CM | POA: Diagnosis not present

## 2024-01-02 ENCOUNTER — Other Ambulatory Visit: Payer: Self-pay | Admitting: Otolaryngology

## 2024-01-05 ENCOUNTER — Encounter: Payer: Self-pay | Admitting: Otolaryngology

## 2024-01-05 NOTE — Anesthesia Preprocedure Evaluation (Signed)
 Anesthesia Evaluation  Patient identified by MRN, date of birth, ID band Patient awake    Reviewed: Allergy & Precautions, H&P , NPO status , Patient's Chart, lab work & pertinent test results  Airway Mallampati: II  TM Distance: >3 FB     Dental   Pulmonary neg pulmonary ROS          Cardiovascular hypertension, negative cardio ROS      Neuro/Psych  Neuromuscular disease negative neurological ROS  negative psych ROS   GI/Hepatic negative GI ROS, Neg liver ROS,,,  Endo/Other  negative endocrine ROS    Renal/GU negative Renal ROS  negative genitourinary   Musculoskeletal negative musculoskeletal ROS (+) Arthritis ,    Abdominal   Peds negative pediatric ROS (+)  Hematology negative hematology ROS (+)   Anesthesia Other Findings HTN Arthritis   Reproductive/Obstetrics negative OB ROS                              Anesthesia Physical Anesthesia Plan  ASA: 2  Anesthesia Plan: General   Post-op Pain Management:    Induction: Intravenous  PONV Risk Score and Plan:   Airway Management Planned: LMA  Additional Equipment:   Intra-op Plan:   Post-operative Plan: Extubation in OR  Informed Consent: I have reviewed the patients History and Physical, chart, labs and discussed the procedure including the risks, benefits and alternatives for the proposed anesthesia with the patient or authorized representative who has indicated his/her understanding and acceptance.     Dental Advisory Given  Plan Discussed with: Anesthesiologist, CRNA and Surgeon  Anesthesia Plan Comments: (Patient consented for risks of anesthesia including but not limited to:  - adverse reactions to medications - damage to eyes, teeth, lips or other oral mucosa - nerve damage due to positioning  - sore throat or hoarseness - Damage to heart, brain, nerves, lungs, other parts of body or loss of life  Patient  voiced understanding and assent.)         Anesthesia Quick Evaluation

## 2024-01-08 ENCOUNTER — Other Ambulatory Visit: Payer: Self-pay

## 2024-01-08 ENCOUNTER — Ambulatory Visit: Payer: Self-pay | Admitting: Anesthesiology

## 2024-01-08 ENCOUNTER — Encounter
Admission: RE | Disposition: A | Discharge status: Home / Self Care | Payer: Self-pay | Source: Home / Self Care | Attending: Otolaryngology

## 2024-01-08 ENCOUNTER — Ambulatory Visit
Admission: RE | Admit: 2024-01-08 | Discharge: 2024-01-08 | Disposition: A | Discharge status: Home / Self Care | Attending: Otolaryngology | Admitting: Otolaryngology

## 2024-01-08 ENCOUNTER — Encounter: Payer: Self-pay | Admitting: Otolaryngology

## 2024-01-08 DIAGNOSIS — F17220 Nicotine dependence, chewing tobacco, uncomplicated: Secondary | ICD-10-CM | POA: Insufficient documentation

## 2024-01-08 DIAGNOSIS — D3702 Neoplasm of uncertain behavior of tongue: Secondary | ICD-10-CM | POA: Diagnosis not present

## 2024-01-08 DIAGNOSIS — D101 Benign neoplasm of tongue: Secondary | ICD-10-CM | POA: Diagnosis not present

## 2024-01-08 DIAGNOSIS — K140 Glossitis: Secondary | ICD-10-CM | POA: Diagnosis not present

## 2024-01-08 DIAGNOSIS — I10 Essential (primary) hypertension: Secondary | ICD-10-CM | POA: Diagnosis not present

## 2024-01-08 HISTORY — DX: Unspecified osteoarthritis, unspecified site: M19.90

## 2024-01-08 HISTORY — PX: EXCISION OF TONGUE LESION: SHX6434

## 2024-01-08 SURGERY — EXCISION, LESION, TONGUE
Anesthesia: General | Laterality: Left

## 2024-01-08 MED ORDER — PHENYLEPHRINE HCL (PRESSORS) 10 MG/ML IV SOLN
INTRAVENOUS | Status: DC | PRN
  Administered 2024-01-08: 160 ug via INTRAVENOUS

## 2024-01-08 MED ORDER — ONDANSETRON HCL 4 MG/2ML IJ SOLN
INTRAMUSCULAR | Status: AC
  Filled 2024-01-08: qty 6

## 2024-01-08 MED ORDER — LIDOCAINE HCL (CARDIAC) PF 100 MG/5ML IV SOSY
PREFILLED_SYRINGE | INTRAVENOUS | Status: DC | PRN
  Administered 2024-01-08: 60 mg via INTRATRACHEAL

## 2024-01-08 MED ORDER — DEXAMETHASONE SODIUM PHOSPHATE 4 MG/ML IJ SOLN
INTRAMUSCULAR | Status: DC | PRN
  Administered 2024-01-08: 4 mg via INTRAVENOUS

## 2024-01-08 MED ORDER — DEXAMETHASONE SODIUM PHOSPHATE 4 MG/ML IJ SOLN
INTRAMUSCULAR | Status: AC
  Filled 2024-01-08: qty 3

## 2024-01-08 MED ORDER — LIDOCAINE-EPINEPHRINE 1 %-1:100000 IJ SOLN
INTRAMUSCULAR | Status: DC | PRN
Start: 2024-01-08 — End: 2024-01-08
  Administered 2024-01-08: 2 mL

## 2024-01-08 MED ORDER — GLYCOPYRROLATE 0.2 MG/ML IJ SOLN
INTRAMUSCULAR | Status: AC
  Filled 2024-01-08: qty 3

## 2024-01-08 MED ORDER — MIDAZOLAM HCL 5 MG/5ML IJ SOLN
INTRAMUSCULAR | Status: DC | PRN
  Administered 2024-01-08: 2 mg via INTRAVENOUS

## 2024-01-08 MED ORDER — LACTATED RINGERS IV SOLN: INTRAVENOUS | Status: DC

## 2024-01-08 MED ORDER — MIDAZOLAM HCL 2 MG/2ML IJ SOLN
INTRAMUSCULAR | Status: AC
  Filled 2024-01-08: qty 2

## 2024-01-08 MED ORDER — GLYCOPYRROLATE 0.2 MG/ML IJ SOLN
INTRAMUSCULAR | Status: DC | PRN
  Administered 2024-01-08: .2 mg via INTRAVENOUS

## 2024-01-08 MED ORDER — LIDOCAINE HCL (PF) 2 % IJ SOLN
INTRAMUSCULAR | Status: AC
  Filled 2024-01-08: qty 15

## 2024-01-08 MED ORDER — FENTANYL CITRATE (PF) 100 MCG/2ML IJ SOLN
INTRAMUSCULAR | Status: AC
  Filled 2024-01-08: qty 2

## 2024-01-08 MED ORDER — PROPOFOL 10 MG/ML IV BOLUS
INTRAVENOUS | Status: DC | PRN
  Administered 2024-01-08: 200 mg via INTRAVENOUS

## 2024-01-08 MED ORDER — PROPOFOL 10 MG/ML IV BOLUS
INTRAVENOUS | Status: AC
  Filled 2024-01-08: qty 20

## 2024-01-08 MED ORDER — ONDANSETRON HCL 4 MG/2ML IJ SOLN
INTRAMUSCULAR | Status: DC | PRN
  Administered 2024-01-08: 4 mg via INTRAVENOUS

## 2024-01-08 MED ORDER — FENTANYL CITRATE (PF) 100 MCG/2ML IJ SOLN
INTRAMUSCULAR | Status: DC | PRN
  Administered 2024-01-08 (×2): 50 ug via INTRAVENOUS

## 2024-01-08 SURGICAL SUPPLY — 9 items
ELECTRODE REM PT RTRN 9FT ADLT (ELECTROSURGICAL) ×1 IMPLANT
GLOVE SURG GAMMEX PI TX LF 7.5 (GLOVE) ×1 IMPLANT
KIT TURNOVER KIT A (KITS) ×1 IMPLANT
NDL 27GX1/2 REG BEVEL ECLIP (NEEDLE) ×1 IMPLANT
NEEDLE 27GX1/2 REG BEVEL ECLIP (NEEDLE) ×1 IMPLANT
PENCIL SMOKE EVACUATOR (MISCELLANEOUS) ×1 IMPLANT
STRAP BODY AND KNEE 60X3 (MISCELLANEOUS) ×1 IMPLANT
SUT VICRYL 6 0 S 14 UNDY (SUTURE) IMPLANT
TOWEL OR 17X26 4PK STRL BLUE (TOWEL DISPOSABLE) ×1 IMPLANT

## 2024-01-08 NOTE — Anesthesia Procedure Notes (Signed)
 Procedure Name: LMA Insertion Date/Time: 01/08/2024 8:24 AM  Performed by: Levy Harvey, CRNAPre-anesthesia Checklist: Patient identified, Emergency Drugs available, Suction available, Timeout performed and Patient being monitored Patient Re-evaluated:Patient Re-evaluated prior to induction Oxygen Delivery Method: Circle system utilized Preoxygenation: Pre-oxygenation with 100% oxygen Induction Type: IV induction LMA: LMA inserted LMA Size: 4.0 Number of attempts: 1 Placement Confirmation: positive ETCO2 and breath sounds checked- equal and bilateral Tube secured with: Tape

## 2024-01-08 NOTE — Transfer of Care (Signed)
 Immediate Anesthesia Transfer of Care Note  Patient: Bob Ryan  Procedure(s) Performed: EXCISION, LESION, TONGUE (Left)  Patient Location: PACU  Anesthesia Type: General  Level of Consciousness: awake, alert  and patient cooperative  Airway and Oxygen Therapy: Patient Spontanous Breathing and Patient connected to supplemental oxygen  Post-op Assessment: Post-op Vital signs reviewed, Patient's Cardiovascular Status Stable, Respiratory Function Stable, Patent Airway and No signs of Nausea or vomiting  Post-op Vital Signs: Reviewed and stable  Complications: No notable events documented.

## 2024-01-08 NOTE — H&P (Signed)
H&P has been reviewed and patient reevaluated, no changes necessary. To be downloaded later.  

## 2024-01-08 NOTE — Op Note (Signed)
 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  This was already from scar tissue in recurring biting of the area.  Procedure: The patient was given general anesthesia by LMA.  Once patient was asleep a bite guard was placed in the left side of the mouth to hold the mouth open.  A towel clip was used to grasp the anterior tongue and pull it forward.  This was clamped in the midline.  The tongue was pulled forward into the right side so you could see the lesion on the dorsal surface of the left tongue.  This was near the anterior two thirds and posterior one third of the tongue.  It was sticking up approximately 6 to 7 mm.  Local anesthesia was placed using 2 mL of 1% Xylocaine  with epi 1: 100,000 for infiltration of the tongue musculature just deep to the mass.  An elliptical incision was created around the growth in a horizontal fashion.  This was gone through full-thickness of the mucosa down to the muscle layer.  This was trimmed off of the muscle and fully removed and sent for permanent section.  There was minimal oozing from the area.  A 6-0 Vicryl was then used to close the wound with 2 interrupted sutures.  The knots were buried to prevent him from untie us .  This close wound well and there was no further bleeding.  The towel clip and the bite block were removed.  Patient was awakened and taken to recovery room in satisfactory addition.  There were no operative complications.  Dispo:   To PACU to be discharged home  Plan: To follow-up in the office in 1 week to make sure is healing well.  He will start with liquids at home until the  numbing has worn off and the tongue before he starts chewing anything.  He will start with softer foods and increase his diet as tolerated.  He can use some Tylenol  or ibuprofen for pain.  [facility] DEL [name]  01/08/2024 8:41 AM

## 2024-01-08 NOTE — Anesthesia Postprocedure Evaluation (Signed)
 Anesthesia Post Note  Patient: Bob Ryan  Procedure(s) Performed: EXCISION, LESION, TONGUE (Left)  Patient location during evaluation: PACU Anesthesia Type: General Level of consciousness: awake and alert Pain management: pain level controlled Vital Signs Assessment: post-procedure vital signs reviewed and stable Respiratory status: spontaneous breathing, nonlabored ventilation, respiratory function stable and patient connected to nasal cannula oxygen Cardiovascular status: blood pressure returned to baseline and stable Postop Assessment: no apparent nausea or vomiting Anesthetic complications: no   No notable events documented.   Last Vitals:  Vitals:   01/08/24 0842 01/08/24 0853  BP: 118/84 122/89  Pulse: 97 87  Resp: 18 (!) 22  Temp: 36.7 C 36.6 C  SpO2: 95% 94%    Last Pain:  Vitals:   01/08/24 0853  TempSrc:   PainSc: 0-No pain                 Donny JAYSON Mu

## 2024-01-11 ENCOUNTER — Other Ambulatory Visit: Payer: Self-pay | Admitting: Family Medicine

## 2024-01-11 DIAGNOSIS — M19132 Post-traumatic osteoarthritis, left wrist: Secondary | ICD-10-CM

## 2024-01-12 LAB — SURGICAL PATHOLOGY

## 2024-01-13 NOTE — Telephone Encounter (Signed)
 Unable to refill per protocol, Rx expired. Discontinued 01/05/24, patient preference.  Requested Prescriptions  Pending Prescriptions Disp Refills   meloxicam  (MOBIC ) 15 MG tablet [Pharmacy Med Name: MELOXICAM  15 MG TABLET] 30 tablet 2    Sig: TAKE 1 TABLET BY MOUTH EVERY DAY AS NEEDED FOR PAIN     Analgesics:  COX2 Inhibitors Failed - 01/13/2024 11:07 AM      Failed - Manual Review: Labs are only required if the patient has taken medication for more than 8 weeks.      Failed - HCT in normal range and within 360 days    Hematocrit  Date Value Ref Range Status  06/10/2023 53.2 (H) 37.5 - 51.0 % Final         Passed - HGB in normal range and within 360 days    Hemoglobin  Date Value Ref Range Status  06/10/2023 17.6 13.0 - 17.7 g/dL Final         Passed - Cr in normal range and within 360 days    Creatinine, Ser  Date Value Ref Range Status  06/10/2023 1.08 0.76 - 1.27 mg/dL Final         Passed - AST in normal range and within 360 days    AST  Date Value Ref Range Status  06/10/2023 31 0 - 40 IU/L Final         Passed - ALT in normal range and within 360 days    ALT  Date Value Ref Range Status  06/10/2023 34 0 - 44 IU/L Final         Passed - eGFR is 30 or above and within 360 days    eGFR  Date Value Ref Range Status  06/10/2023 85 >59 mL/min/1.73 Final         Passed - Patient is not pregnant      Passed - Valid encounter within last 12 months    Recent Outpatient Visits           3 weeks ago Lesion of tongue   [facility] Alvia, Selinda PARAS, MD   6 months ago Post-traumatic osteoarthritis of left wrist   [facility] at [facility] Alvia, Selinda PARAS, MD   7 months ago Healthcare maintenance   [facility] at [facility], Selinda PARAS, MD       Future Appointments             In 5 months Alvia, Selinda PARAS, MD [facility]  Care & [facility] Medicine at [facility], [phone] [place]

## 2024-01-19 ENCOUNTER — Other Ambulatory Visit: Payer: Self-pay | Admitting: Family Medicine

## 2024-01-19 DIAGNOSIS — N529 Male erectile dysfunction, unspecified: Secondary | ICD-10-CM

## 2024-01-20 NOTE — Telephone Encounter (Signed)
 Requested Prescriptions  Pending Prescriptions Disp Refills   sildenafil  (VIAGRA ) 50 MG tablet [Pharmacy Med Name: SILDENAFIL  50 MG TABLET] 90 tablet 1    Sig: TAKE 1 TABLET BY MOUTH DAILY AS NEEDED FOR ERECTILE DYSFUNCTION     Urology: Erectile Dysfunction Agents Passed - 01/20/2024  9:12 AM      Passed - AST in normal range and within 360 days    AST  Date Value Ref Range Status  06/10/2023 31 0 - 40 IU/L Final         Passed - ALT in normal range and within 360 days    ALT  Date Value Ref Range Status  06/10/2023 34 0 - 44 IU/L Final         Passed - Last BP in normal range    BP Readings from Last 1 Encounters:  01/08/24 122/89         Passed - Valid encounter within last 12 months    Recent Outpatient Visits           4 weeks ago Lesion of tongue   [facility] Alvia, Selinda PARAS, MD   6 months ago Post-traumatic osteoarthritis of left wrist   [facility] at [facility] Alvia, Selinda PARAS, MD   7 months ago Healthcare maintenance   [facility] & [facility] Medicine at [facility], Selinda PARAS, MD       Future Appointments             In 4 months Alvia, Selinda PARAS, MD [facility] Primary Care & [facility] Medicine at [facility], [phone] [place]

## 2024-03-01 ENCOUNTER — Encounter: Payer: Self-pay | Admitting: Radiology

## 2024-06-11 ENCOUNTER — Encounter: Payer: BC Managed Care – PPO | Admitting: Family Medicine
